# Patient Record
Sex: Male | Born: 1975 | Race: Black or African American | Hispanic: No | Marital: Married | Smoking: Never smoker
Health system: Southern US, Community
[De-identification: ages and names within clinical notes are randomized; demographics above are authoritative.]

## PROBLEM LIST (undated history)

## (undated) DIAGNOSIS — E119 Type 2 diabetes mellitus without complications: Secondary | ICD-10-CM

## (undated) DIAGNOSIS — I10 Essential (primary) hypertension: Secondary | ICD-10-CM

## (undated) HISTORY — PX: BACK SURGERY: SHX140

---

## 2013-01-03 DIAGNOSIS — I10 Essential (primary) hypertension: Secondary | ICD-10-CM | POA: Diagnosis present

## 2013-01-03 DIAGNOSIS — E119 Type 2 diabetes mellitus without complications: Secondary | ICD-10-CM | POA: Insufficient documentation

## 2013-01-03 DIAGNOSIS — E785 Hyperlipidemia, unspecified: Secondary | ICD-10-CM | POA: Insufficient documentation

## 2017-08-30 ENCOUNTER — Encounter (HOSPITAL_COMMUNITY): Payer: Self-pay | Admitting: Emergency Medicine

## 2017-08-30 ENCOUNTER — Inpatient Hospital Stay (HOSPITAL_COMMUNITY)
Admission: EM | Admit: 2017-08-30 | Discharge: 2017-09-02 | DRG: 075 | Disposition: A | Discharge status: Home / Self Care | Payer: Medicaid Other | Attending: Internal Medicine | Admitting: Internal Medicine

## 2017-08-30 ENCOUNTER — Other Ambulatory Visit: Payer: Self-pay

## 2017-08-30 DIAGNOSIS — R509 Fever, unspecified: Secondary | ICD-10-CM

## 2017-08-30 DIAGNOSIS — E1165 Type 2 diabetes mellitus with hyperglycemia: Secondary | ICD-10-CM | POA: Diagnosis present

## 2017-08-30 DIAGNOSIS — Z91128 Patient's intentional underdosing of medication regimen for other reason: Secondary | ICD-10-CM

## 2017-08-30 DIAGNOSIS — I10 Essential (primary) hypertension: Secondary | ICD-10-CM | POA: Diagnosis present

## 2017-08-30 DIAGNOSIS — A879 Viral meningitis, unspecified: Principal | ICD-10-CM | POA: Diagnosis present

## 2017-08-30 DIAGNOSIS — Z833 Family history of diabetes mellitus: Secondary | ICD-10-CM

## 2017-08-30 DIAGNOSIS — R51 Headache: Secondary | ICD-10-CM

## 2017-08-30 DIAGNOSIS — R519 Headache, unspecified: Secondary | ICD-10-CM

## 2017-08-30 DIAGNOSIS — R6510 Systemic inflammatory response syndrome (SIRS) of non-infectious origin without acute organ dysfunction: Secondary | ICD-10-CM | POA: Diagnosis present

## 2017-08-30 HISTORY — DX: Essential (primary) hypertension: I10

## 2017-08-30 HISTORY — DX: Type 2 diabetes mellitus without complications: E11.9

## 2017-08-30 LAB — URINALYSIS, ROUTINE W REFLEX MICROSCOPIC
BACTERIA UA: NONE SEEN
Bilirubin Urine: NEGATIVE
Glucose, UA: 50 mg/dL — AB
Ketones, ur: 5 mg/dL — AB
Leukocytes, UA: NEGATIVE
Nitrite: NEGATIVE
PH: 7 (ref 5.0–8.0)
Protein, ur: 30 mg/dL — AB
SPECIFIC GRAVITY, URINE: 1.02 (ref 1.005–1.030)

## 2017-08-30 LAB — CBC
HCT: 42.3 % (ref 39.0–52.0)
HEMOGLOBIN: 13.6 g/dL (ref 13.0–17.0)
MCH: 26.4 pg (ref 26.0–34.0)
MCHC: 32.2 g/dL (ref 30.0–36.0)
MCV: 82 fL (ref 78.0–100.0)
Platelets: 193 10*3/uL (ref 150–400)
RBC: 5.16 MIL/uL (ref 4.22–5.81)
RDW: 12.3 % (ref 11.5–15.5)
WBC: 6 10*3/uL (ref 4.0–10.5)

## 2017-08-30 MED ORDER — ONDANSETRON 4 MG PO TBDP
4.0000 mg | ORAL_TABLET | Freq: Once | ORAL | Status: AC | PRN
  Administered 2017-08-30: 4 mg via ORAL
  Filled 2017-08-30: qty 1

## 2017-08-30 NOTE — ED Triage Notes (Signed)
Pt states he began having a headache yesterday accompanied by fever, nausea, vomiting and body aches. No diarrhea.

## 2017-08-31 ENCOUNTER — Other Ambulatory Visit: Payer: Self-pay

## 2017-08-31 ENCOUNTER — Encounter (HOSPITAL_COMMUNITY): Payer: Self-pay | Admitting: Internal Medicine

## 2017-08-31 ENCOUNTER — Emergency Department (HOSPITAL_COMMUNITY): Payer: Medicaid Other

## 2017-08-31 ENCOUNTER — Inpatient Hospital Stay (HOSPITAL_COMMUNITY): Payer: Medicaid Other

## 2017-08-31 DIAGNOSIS — R509 Fever, unspecified: Secondary | ICD-10-CM

## 2017-08-31 DIAGNOSIS — A879 Viral meningitis, unspecified: Secondary | ICD-10-CM | POA: Diagnosis present

## 2017-08-31 DIAGNOSIS — Z833 Family history of diabetes mellitus: Secondary | ICD-10-CM | POA: Diagnosis not present

## 2017-08-31 DIAGNOSIS — R51 Headache: Secondary | ICD-10-CM | POA: Diagnosis present

## 2017-08-31 DIAGNOSIS — I10 Essential (primary) hypertension: Secondary | ICD-10-CM

## 2017-08-31 DIAGNOSIS — E1165 Type 2 diabetes mellitus with hyperglycemia: Secondary | ICD-10-CM

## 2017-08-31 DIAGNOSIS — R6510 Systemic inflammatory response syndrome (SIRS) of non-infectious origin without acute organ dysfunction: Secondary | ICD-10-CM | POA: Diagnosis not present

## 2017-08-31 DIAGNOSIS — Z91128 Patient's intentional underdosing of medication regimen for other reason: Secondary | ICD-10-CM | POA: Diagnosis not present

## 2017-08-31 LAB — COMPREHENSIVE METABOLIC PANEL
ALBUMIN: 4.5 g/dL (ref 3.5–5.0)
ALT: 17 U/L (ref 0–44)
ANION GAP: 10 (ref 5–15)
AST: 19 U/L (ref 15–41)
Alkaline Phosphatase: 54 U/L (ref 38–126)
BUN: 7 mg/dL (ref 6–20)
CALCIUM: 9.2 mg/dL (ref 8.9–10.3)
CO2: 26 mmol/L (ref 22–32)
Chloride: 104 mmol/L (ref 98–111)
Creatinine, Ser: 0.9 mg/dL (ref 0.61–1.24)
GFR calc Af Amer: >60 mL/min (ref >60)
GFR calc non Af Amer: >60 mL/min (ref >60)
GLUCOSE: 163 mg/dL — AB (ref 70–99)
Potassium: 3.7 mmol/L (ref 3.5–5.1)
Sodium: 140 mmol/L (ref 135–145)
TOTAL PROTEIN: 7.5 g/dL (ref 6.5–8.1)
Total Bilirubin: 2.3 mg/dL — ABNORMAL HIGH (ref 0.3–1.2)

## 2017-08-31 LAB — GROUP A STREP BY PCR: GROUP A STREP BY PCR: NOT DETECTED

## 2017-08-31 LAB — CSF CELL COUNT WITH DIFFERENTIAL
Lymphs, CSF: 60 % (ref 40–80)
MONOCYTE-MACROPHAGE-SPINAL FLUID: 39 % (ref 15–45)
RBC COUNT CSF: 2 /mm3 — AB
Segmented Neutrophils-CSF: 1 % (ref 0–6)
TUBE #: 3
WBC, CSF: 131 /mm3 (ref 0–5)

## 2017-08-31 LAB — GLUCOSE, CAPILLARY
Glucose-Capillary: 172 mg/dL — ABNORMAL HIGH (ref 70–99)
Glucose-Capillary: 248 mg/dL — ABNORMAL HIGH (ref 70–99)
Glucose-Capillary: 222 mg/dL — ABNORMAL HIGH (ref 70–99)
Glucose-Capillary: 215 mg/dL — ABNORMAL HIGH (ref 70–99)
Glucose-Capillary: 194 mg/dL — ABNORMAL HIGH (ref 70–99)

## 2017-08-31 LAB — HIV ANTIBODY (ROUTINE TESTING W REFLEX): HIV Screen 4th Generation wRfx: NONREACTIVE

## 2017-08-31 LAB — PROTEIN, CSF: Total  Protein, CSF: 51 mg/dL — ABNORMAL HIGH (ref 15–45)

## 2017-08-31 LAB — SEDIMENTATION RATE: SED RATE: 0 mm/h (ref 0–16)

## 2017-08-31 LAB — GLUCOSE, CSF: Glucose, CSF: 114 mg/dL — ABNORMAL HIGH (ref 40–70)

## 2017-08-31 LAB — HEMOGLOBIN A1C
Hgb A1c MFr Bld: 7.7 % — ABNORMAL HIGH (ref 4.8–5.6)
Mean Plasma Glucose: 174.29 mg/dL

## 2017-08-31 LAB — LIPASE, BLOOD: Lipase: 25 U/L (ref 11–51)

## 2017-08-31 LAB — I-STAT CG4 LACTIC ACID, ED: Lactic Acid, Venous: 1.56 mmol/L (ref 0.5–1.9)

## 2017-08-31 MED ORDER — DEXTROSE 5 % IV SOLN
10.0000 mg/kg | Freq: Three times a day (TID) | INTRAVENOUS | Status: DC
  Administered 2017-08-31 – 2017-09-02 (×6): 1020 mg via INTRAVENOUS
  Filled 2017-08-31 (×2): qty 20.4
  Filled 2017-08-31: qty 10
  Filled 2017-08-31 (×5): qty 20.4

## 2017-08-31 MED ORDER — SODIUM CHLORIDE 0.9 % IV SOLN
INTRAVENOUS | Status: AC
  Administered 2017-08-31 (×3): via INTRAVENOUS

## 2017-08-31 MED ORDER — VANCOMYCIN HCL IN DEXTROSE 1-5 GM/200ML-% IV SOLN: 1000.0000 mg | Freq: Once | INTRAVENOUS | Status: DC

## 2017-08-31 MED ORDER — SODIUM CHLORIDE 0.9 % IV SOLN
INTRAVENOUS | Status: DC | PRN
  Administered 2017-08-31 – 2017-09-01 (×2): via INTRAVENOUS

## 2017-08-31 MED ORDER — VANCOMYCIN HCL 10 G IV SOLR
2000.0000 mg | Freq: Once | INTRAVENOUS | Status: AC
  Administered 2017-08-31: 2000 mg via INTRAVENOUS
  Filled 2017-08-31: qty 2000

## 2017-08-31 MED ORDER — DEXAMETHASONE SODIUM PHOSPHATE 10 MG/ML IJ SOLN
10.0000 mg | Freq: Once | INTRAMUSCULAR | Status: AC
  Administered 2017-08-31: 10 mg via INTRAVENOUS
  Filled 2017-08-31: qty 1

## 2017-08-31 MED ORDER — ACETAMINOPHEN 650 MG RE SUPP: 650.0000 mg | Freq: Four times a day (QID) | RECTAL | Status: DC | PRN

## 2017-08-31 MED ORDER — ONDANSETRON HCL 4 MG/2ML IJ SOLN
4.0000 mg | Freq: Once | INTRAMUSCULAR | Status: AC
  Administered 2017-08-31: 4 mg via INTRAVENOUS
  Filled 2017-08-31: qty 2

## 2017-08-31 MED ORDER — ACETAMINOPHEN 325 MG PO TABS
650.0000 mg | ORAL_TABLET | Freq: Once | ORAL | Status: AC
  Administered 2017-08-31: 650 mg via ORAL
  Filled 2017-08-31: qty 2

## 2017-08-31 MED ORDER — ACETAMINOPHEN 325 MG PO TABS
650.0000 mg | ORAL_TABLET | Freq: Four times a day (QID) | ORAL | Status: DC | PRN
  Administered 2017-08-31 – 2017-09-01 (×2): 650 mg via ORAL
  Filled 2017-08-31 (×2): qty 2

## 2017-08-31 MED ORDER — HYDRALAZINE HCL 20 MG/ML IJ SOLN
10.0000 mg | INTRAMUSCULAR | Status: DC | PRN
  Administered 2017-09-02: 10 mg via INTRAVENOUS
  Filled 2017-08-31: qty 1

## 2017-08-31 MED ORDER — ONDANSETRON HCL 4 MG PO TABS: 4.0000 mg | ORAL_TABLET | Freq: Four times a day (QID) | ORAL | Status: DC | PRN

## 2017-08-31 MED ORDER — SODIUM CHLORIDE 0.9 % IV SOLN
2.0000 g | Freq: Two times a day (BID) | INTRAVENOUS | Status: DC
  Administered 2017-08-31: 2 g via INTRAVENOUS
  Filled 2017-08-31 (×2): qty 20

## 2017-08-31 MED ORDER — CEFTRIAXONE SODIUM 2 G IJ SOLR
2.0000 g | Freq: Once | INTRAMUSCULAR | Status: AC
  Administered 2017-08-31: 2 g via INTRAVENOUS
  Filled 2017-08-31: qty 20

## 2017-08-31 MED ORDER — ONDANSETRON HCL 4 MG/2ML IJ SOLN
4.0000 mg | Freq: Four times a day (QID) | INTRAMUSCULAR | Status: DC | PRN
  Administered 2017-09-01: 4 mg via INTRAVENOUS
  Filled 2017-08-31: qty 2

## 2017-08-31 MED ORDER — LIDOCAINE HCL (PF) 1 % IJ SOLN
INTRAMUSCULAR | Status: AC
  Administered 2017-08-31: 15:00:00
  Filled 2017-08-31: qty 5

## 2017-08-31 MED ORDER — INSULIN ASPART 100 UNIT/ML ~~LOC~~ SOLN
0.0000 [IU] | Freq: Three times a day (TID) | SUBCUTANEOUS | Status: DC
  Administered 2017-08-31: 2 [IU] via SUBCUTANEOUS
  Administered 2017-08-31 (×2): 3 [IU] via SUBCUTANEOUS
  Administered 2017-09-01 – 2017-09-02 (×4): 2 [IU] via SUBCUTANEOUS

## 2017-08-31 MED ORDER — LIDOCAINE HCL (PF) 1 % IJ SOLN
5.0000 mL | Freq: Once | INTRAMUSCULAR | Status: AC
  Filled 2017-08-31: qty 5

## 2017-08-31 MED ORDER — SODIUM CHLORIDE 0.9 % IV BOLUS
1000.0000 mL | Freq: Once | INTRAVENOUS | Status: AC
  Administered 2017-08-31: 1000 mL via INTRAVENOUS

## 2017-08-31 MED ORDER — VANCOMYCIN HCL IN DEXTROSE 1-5 GM/200ML-% IV SOLN
1000.0000 mg | Freq: Three times a day (TID) | INTRAVENOUS | Status: DC
  Administered 2017-08-31 – 2017-09-01 (×3): 1000 mg via INTRAVENOUS
  Filled 2017-08-31 (×4): qty 200

## 2017-08-31 MED ORDER — LIDOCAINE HCL (PF) 1 % IJ SOLN
INTRAMUSCULAR | Status: AC
  Filled 2017-08-31: qty 30

## 2017-08-31 MED ORDER — MORPHINE SULFATE (PF) 4 MG/ML IV SOLN
4.0000 mg | Freq: Once | INTRAVENOUS | Status: AC
  Administered 2017-08-31: 4 mg via INTRAVENOUS
  Filled 2017-08-31: qty 1

## 2017-08-31 MED ORDER — MORPHINE SULFATE (PF) 2 MG/ML IV SOLN
1.0000 mg | INTRAVENOUS | Status: DC | PRN
  Administered 2017-08-31 – 2017-09-02 (×4): 1 mg via INTRAVENOUS
  Filled 2017-08-31 (×4): qty 1

## 2017-08-31 NOTE — H&P (Signed)
History and Physical    Noah Bates ZOX:[id number] DOB: 24-Jan-1976 DOA: 08/30/2017  PCP: Patient, No Pcp Per  Patient coming from: Home.  Chief Complaint: Headache.  HPI: Noah Bates is a 42 y.o. male with history of diabetes mellitus type 2, hypertension who had moved from [place] last October 8 months ago and has not been taking his medications presents to the ER with complaints of headache and fever chills.  Patient has been having the symptoms for last 48 hours.  Denies any recent travel.  Patient states his friend has been sick last week.  Patient had multiple episodes of nausea vomiting denies any abdominal pain or diarrhea.  Has some photophobia.  Had some neck pain.  Has not had any meningitis previously.  Patient is alert awake and oriented.  ED Course: In the ER CT head is unremarkable chest x-ray unremarkable UA did not show any signs of infection.  Blood cultures were obtained and lumbar puncture was attempted by the ER physician which was unsuccessful.  At this time fluoroscopic guided lumbar puncture has been ordered and patient was started empirically on ceftriaxone vancomycin and acyclovir.  Review of Systems: As per HPI, rest all negative.   Past Medical History:  Diagnosis Date  . Diabetes mellitus without complication (HCC)   . Hypertension     History reviewed. No pertinent surgical history.   reports that he has never smoked. He has never used smokeless tobacco. He reports that he drank alcohol. He reports that he does not use drugs.  Not on File  Family History  Problem Relation Age of Onset  . Diabetes Mellitus II Mother   . Diabetes Mellitus II Father     Prior to Admission medications   Not on File    Physical Exam: Vitals:   08/31/17 0354 08/31/17 0507 08/31/17 0530 08/31/17 0600  BP: (!) 188/95  (!) 163/82 (!) 156/81  Pulse: (!) 103  100 (!) 103  Resp: 18  18   Temp: 99.9 F (37.7 C)     TempSrc: Oral     SpO2: 100%  97%  98%  Weight:  102.1 kg (225 lb)    Height:  6\' 7"  (2.007 m)        Constitutional: Moderately built and nourished. Vitals:   08/31/17 0354 08/31/17 0507 08/31/17 0530 08/31/17 0600  BP: (!) 188/95  (!) 163/82 (!) 156/81  Pulse: (!) 103  100 (!) 103  Resp: 18  18   Temp: 99.9 F (37.7 C)     TempSrc: Oral     SpO2: 100%  97% 98%  Weight:  102.1 kg (225 lb)    Height:  6\' 7"  (2.007 m)     Eyes: Anicteric no pallor. ENMT: No discharge from the ears eyes nose or mouth. Neck: No mass felt.  Mild neck rigidity. Respiratory: No rhonchi or crepitations. Cardiovascular: S1-S2 heard no murmurs appreciated. Abdomen: Soft nontender bowel sounds present. Musculoskeletal: No edema.  No joint effusion. Skin: No rash. Neurologic: Alert awake oriented to time place and person.  Moves all activities. Psychiatric: Appears normal per normal affect.   Labs on Admission: I have personally reviewed following labs and imaging studies  CBC: Recent Labs  Lab 08/30/17 2324  WBC 6.0  HGB 13.6  HCT 42.3  MCV 82.0  PLT 193   Basic Metabolic Panel: Recent Labs  Lab 08/30/17 2324  NA 140  K 3.7  CL 104  CO2 26  GLUCOSE 163*  BUN 7  CREATININE 0.90  CALCIUM 9.2   GFR: Estimated Creatinine Clearance: 140.3 mL/min (by C-G formula based on SCr of 0.9 mg/dL). Liver Function Tests: Recent Labs  Lab 08/30/17 2324  AST 19  ALT 17  ALKPHOS 54  BILITOT 2.3*  PROT 7.5  ALBUMIN 4.5   Recent Labs  Lab 08/30/17 2324  LIPASE 25   No results for input(s): AMMONIA in the last 168 hours. Coagulation Profile: No results for input(s): INR, PROTIME in the last 168 hours. Cardiac Enzymes: No results for input(s): CKTOTAL, CKMB, CKMBINDEX, TROPONINI in the last 168 hours. BNP (last 3 results) No results for input(s): PROBNP in the last 8760 hours. HbA1C: No results for input(s): HGBA1C in the last 72 hours. CBG: No results for input(s): GLUCAP in the last 168 hours. Lipid Profile: No  results for input(s): CHOL, HDL, LDLCALC, TRIG, CHOLHDL, LDLDIRECT in the last 72 hours. Thyroid Function Tests: No results for input(s): TSH, T4TOTAL, FREET4, T3FREE, THYROIDAB in the last 72 hours. Anemia Panel: No results for input(s): VITAMINB12, FOLATE, FERRITIN, TIBC, IRON, RETICCTPCT in the last 72 hours. Urine analysis:    Component Value Date/Time   COLORURINE YELLOW 08/30/2017 2255   APPEARANCEUR CLEAR 08/30/2017 2255   LABSPEC 1.020 08/30/2017 2255   PHURINE 7.0 08/30/2017 2255   GLUCOSEU 50 (A) 08/30/2017 2255   HGBUR SMALL (A) 08/30/2017 2255   BILIRUBINUR NEGATIVE 08/30/2017 2255   KETONESUR 5 (A) 08/30/2017 2255   PROTEINUR 30 (A) 08/30/2017 2255   NITRITE NEGATIVE 08/30/2017 2255   LEUKOCYTESUR NEGATIVE 08/30/2017 2255   Sepsis Labs: @LABRCNTIP (procalcitonin:4,lacticidven:4) ) Recent Results (from the past 240 hour(s))  Group A Strep by PCR     Status: None   Collection Time: 08/31/17  2:55 AM  Result Value Ref Range Status   Group A Strep by PCR NOT DETECTED NOT DETECTED Final    Comment: Performed at [facility], [address] [place], [place] [id number]     Radiological Exams on Admission: Dg Chest 2 View  Result Date: 08/31/2017 CLINICAL DATA:  Nausea, vomiting, fever and cough since yesterday. EXAM: CHEST - 2 VIEW COMPARISON:  None. FINDINGS: The heart size and mediastinal contours are within normal limits. Both lungs are clear. The visualized skeletal structures are unremarkable. IMPRESSION: No active cardiopulmonary disease. Electronically Signed   By: Tollie Ethavid  Kwon M.D.   On: 08/31/2017 02:11   Ct Head Wo Contrast  Result Date: 08/31/2017 CLINICAL DATA:  42 y/o M; headache, fever, lightheadedness, nausea, vomiting, and body aches since Monday. EXAM: CT HEAD WITHOUT CONTRAST TECHNIQUE: Contiguous axial images were obtained from the base of the skull through the vertex without intravenous contrast. COMPARISON:  None. FINDINGS: Brain: No evidence of  acute infarction, hemorrhage, hydrocephalus, extra-axial collection or mass lesion/mass effect. Vascular: No hyperdense vessel or unexpected calcification. Skull: Normal. Negative for fracture or focal lesion. Sinuses/Orbits: No acute finding. Other: None. IMPRESSION: Negative CT of the head. No acute intracranial abnormality identified. Electronically Signed   By: Mitzi HansenLance  Furusawa-Stratton M.D.   On: 08/31/2017 04:30     Assessment/Plan Active Problems:   SIRS (systemic inflammatory response syndrome) (HCC)   Essential hypertension   Type 2 diabetes mellitus with hyperglycemia (HCC)    1. SIRS likely from meningitis -fluoroscopic guided lumbar puncture has been ordered since the ER physician attempted lumbar puncture which was not successful.  Follow blood cultures CSF studies.  Patient is on empiric antibiotics for meningitis including vancomycin and ceftriaxone and acyclovir.  Continue with hydration pain relief medications. 2. Hypertension -has not been taking his medications for last 8 months.  Will keep patient on PRN IV hydralazine for now and closely follow blood pressure trends.  3. Diabetes mellitus type 2 -has not been taking his medications for last few months we will check hemoglobin A1c keep patient on sliding scale coverage.   DVT prophylaxis: SCDs. Code Status: Full code. Family Communication: Discussed with patient. Disposition Plan: Home. Consults called: None. Admission status: Inpatient.   Eduard Clos MD [facility] Pager [phone].  If 7PM-7AM, please contact night-coverage [email or web address] Password [facility]  08/31/2017, 6:49 AM

## 2017-08-31 NOTE — Progress Notes (Signed)
.  CRITICAL VALUE ALERT  Critical Value:  CSF WBC 131  Date & Time Notied:  1545 08/31/2017  Provider Notified: Rito EhrlichKrishnan, MD  Orders Received/Actions taken: awaiting MD orders and will treat accordingly.

## 2017-08-31 NOTE — Progress Notes (Signed)
Pt arrived to room at 0630. Pt A/Ox3, reporting mild headache, no nausea. Ambulates without difficulty. IVF's initiated, R/L hand IV's patent. Telemetry on, Call bell in reach, report given to MaldenKamila, Charity fundraiserN.

## 2017-08-31 NOTE — ED Notes (Signed)
ED Provider at bedside. 

## 2017-08-31 NOTE — Progress Notes (Signed)
Pharmacy Antibiotic Note  Mervin HackJamain Kem Kaysndra Casimir is a 42 y.o. male admitted on 08/30/2017 with menigitidis.  Pharmacy has been consulted for vancomycin and acyclovir dosing.  Plan: Vancomcyin 2gm IV x 1 then 1gm IV q8 hours Acyclovir 1020 mg IV q8 hours F/u renal function, cultures and clinical course  Height: 6\' 7"  (200.7 cm) Weight: 225 lb (102.1 kg) IBW/kg (Calculated) : 93.7  Temp (24hrs), Avg:100.6 F (38.1 C), Min:99.9 F (37.7 C), Max:101.6 F (38.7 C)  Recent Labs  Lab 08/30/17 2324 08/31/17 0237  WBC 6.0  --   CREATININE 0.90  --   LATICACIDVEN  --  1.56    Estimated Creatinine Clearance: 140.3 mL/min (by C-G formula based on SCr of 0.9 mg/dL).    Not on File  Thank you for allowing pharmacy to be a part of this patient's care.  Talbert CageSeay, Kelon Easom Poteet 08/31/2017 5:16 AM

## 2017-08-31 NOTE — Progress Notes (Signed)
Patient admitted earlier this morning.  H&P reviewed.  Patient seen and examined.  S: Patient states that he feels much better.  Headache has improved significantly.  2/10 intensity.  No longer has sensitivity to light.  Denies any nausea.  O: Noted to be afebrile this morning.  Blood pressure was somewhat elevated at 168/89.  Other vital signs stable.  Neck is soft and supple. Lungs are clear to auscultation bilaterally. S1-S2 is normal regular.  No S3-S4.  No rubs murmurs or bruit Abdomen is soft.  Nontender nondistended.  Bowel sounds are present.  No masses organomegaly  Labs reviewed.  A/P: Patient with fever headache nausea.  Concern was for meningitis.  Likely has had some kind of viral syndrome.  His WBC was normal.  Very unlikely to be a bacterial infection.  HIV nonreactive.  LP attempted at bedside was not successful.  LP has been ordered under fluoroscopy.  He has a history of hypertension but has not been taking his medications.  Blood pressure is poorly controlled.  Continue hydralazine as needed for now.  Will likely need definitive treatment at discharge.  Diabetes mellitus type 2: Patient has not taken his medications in a while.  ABGs noted to be elevated.  HbA1c 7.7.  He will need to resume his medication regimen.  Continue current management.  We will continue to follow.  Noah Bates 08/31/2017

## 2017-08-31 NOTE — Procedures (Signed)
CLINICAL DATA: Fever and headache, possible meningitis.  EXAM:  DIAGNOSTIC LUMBAR PUNCTURE UNDER FLUOROSCOPIC GUIDANCE  FLUOROSCOPY TIME: Fluoroscopy Time:  0 minutes, 30 seconds  Radiation Exposure Index (if provided by the fluoroscopic device):  5.2 mGy  Number of Acquired Spot Images: 0  PROCEDURE:  I discussed the risks (including hemorrhage, infection, headache, and nerve damage, among others), benefits, and alternatives to fluoroscopically guided lumbar puncture with the patient.  We specifically discussed the high technical likelihood of success of the procedure. The patient understood and elected to undergo the procedure.    Standard time-out was employed.  Following sterile skin prep and local anesthetic administration consisting of 1 percent lidocaine, a 22 gauge spinal needle was advanced without difficulty into the thecal sac at the at the L4-5 level.  Clear CSF was returned.  Opening pressure was 12 cm of water with the patient in the left lateral decubitus position.   13 cc of clear CSF was collected.  The needle was subsequently removed and the skin cleansed and bandaged.  No immediate complications were observed.    IMPRESSION: Technically successful fluoroscopically guided lumbar puncture yielding 13 cc of clear CSF.  Opening pressure was 12 cm water.

## 2017-08-31 NOTE — ED Notes (Signed)
Attempted to call report bed not assigned 

## 2017-08-31 NOTE — ED Provider Notes (Signed)
[facility] EMERGENCY DEPARTMENT Provider Note   CSN: [id number] Arrival date & time: 08/30/17  2214     History   Chief Complaint Chief Complaint  Patient presents with  . Headache  . Fever    HPI Noah Bates is a 42 y.o. male.  Patient with history of diabetes and hypertension who has been off of his medications for several months presenting with a 2-day history of headache, fever, nausea and vomiting.  States he developed a diffuse headache 2 nights ago this progressively worsened.  He developed a fever yesterday up to 102.  He is had multiple episodes of nausea and vomiting today that have been nonbilious and nonbloody.  Denies abdominal pain or diarrhea.  Denies cough, sore throat, runny nose.  No body aches.  Has had shaking chills and rigors.  Denies any recent out of the country travel.  No recent antibiotic use.  No camping trips or tick bites.  No IV drug abuse.  The history is provided by the patient.  Headache   Associated symptoms include a fever, nausea and vomiting. Pertinent negatives include no palpitations and no shortness of breath.  Fever   Associated symptoms include vomiting and headaches. Pertinent negatives include no chest pain, no congestion and no cough.    Past Medical History:  Diagnosis Date  . Diabetes mellitus without complication (HCC)   . Hypertension     There are no active problems to display for this patient.   History reviewed. No pertinent surgical history.      Home Medications    Prior to Admission medications   Not on File    Family History No family history on file.  Social History Social History   Tobacco Use  . Smoking status: Never Smoker  . Smokeless tobacco: Never Used  Substance Use Topics  . Alcohol use: Not Currently  . Drug use: Never     Allergies   Patient has no allergy information on record.   Review of Systems Review of Systems  Constitutional: Positive for  activity change, appetite change and fever.  HENT: Negative for congestion.   Eyes: Positive for photophobia.  Respiratory: Negative for cough, chest tightness and shortness of breath.   Cardiovascular: Negative for chest pain, palpitations and leg swelling.  Gastrointestinal: Positive for nausea and vomiting. Negative for abdominal pain.  Genitourinary: Negative for dysuria, flank pain, scrotal swelling and testicular pain.  Musculoskeletal: Negative for arthralgias and myalgias.  Neurological: Positive for headaches. Negative for light-headedness.  Hematological: Negative for adenopathy.   all other systems are negative except as noted in the HPI and PMH.     Physical Exam Updated Vital Signs BP (!) 223/98   Pulse 93   Temp 100.3 F (37.9 C)   Resp 18   SpO2 100%   Physical Exam  Constitutional: He is oriented to person, place, and time. He appears well-developed and well-nourished. No distress.  Ill appearing  HENT:  Head: Normocephalic and atraumatic.  Mouth/Throat: Oropharynx is clear and moist. No oropharyngeal exudate.  Eyes: Pupils are equal, round, and reactive to light. Conjunctivae and EOM are normal.  Neck: Normal range of motion. Neck supple.  No meningismus.  Cardiovascular: Normal rate, regular rhythm, normal heart sounds and intact distal pulses.  No murmur heard. Pulmonary/Chest: Effort normal and breath sounds normal. No respiratory distress. He exhibits no tenderness.  Abdominal: Soft. There is no tenderness. There is no rebound and no guarding.  Musculoskeletal: Normal range of  motion. He exhibits no edema or tenderness.  Neurological: He is alert and oriented to person, place, and time. No cranial nerve deficit. He exhibits normal muscle tone. Coordination normal.  No ataxia on finger to nose bilaterally. No pronator drift. 5/5 strength throughout. CN 2-12 intact.Equal grip strength. Sensation intact.   Skin: Skin is warm. Capillary refill takes less than 2  seconds. No rash noted. He is diaphoretic.  Psychiatric: He has a normal mood and affect. His behavior is normal.  Nursing note and vitals reviewed.    ED Treatments / Results  Labs (all labs ordered are listed, but only abnormal results are displayed) Labs Reviewed  COMPREHENSIVE METABOLIC PANEL - Abnormal; Notable for the following components:      Result Value   Glucose, Bld 163 (*)    Total Bilirubin 2.3 (*)    All other components within normal limits  URINALYSIS, ROUTINE W REFLEX MICROSCOPIC - Abnormal; Notable for the following components:   Glucose, UA 50 (*)    Hgb urine dipstick SMALL (*)    Ketones, ur 5 (*)    Protein, ur 30 (*)    All other components within normal limits  GROUP A STREP BY PCR  CULTURE, BLOOD (ROUTINE X 2)  CULTURE, BLOOD (ROUTINE X 2)  CSF CULTURE  GRAM STAIN  HSV CULTURE AND TYPING  LIPASE, BLOOD  CBC  CSF CELL COUNT WITH DIFFERENTIAL  CSF CELL COUNT WITH DIFFERENTIAL  GLUCOSE, CSF  PROTEIN, CSF  HERPES SIMPLEX VIRUS(HSV) DNA BY PCR  I-STAT CG4 LACTIC ACID, ED    EKG None  Radiology Dg Chest 2 View  Result Date: 08/31/2017 CLINICAL DATA:  Nausea, vomiting, fever and cough since yesterday. EXAM: CHEST - 2 VIEW COMPARISON:  None. FINDINGS: The heart size and mediastinal contours are within normal limits. Both lungs are clear. The visualized skeletal structures are unremarkable. IMPRESSION: No active cardiopulmonary disease. Electronically Signed   By: Tollie Ethavid  Kwon M.D.   On: 08/31/2017 02:11   Ct Head Wo Contrast  Result Date: 08/31/2017 CLINICAL DATA:  42 y/o M; headache, fever, lightheadedness, nausea, vomiting, and body aches since Monday. EXAM: CT HEAD WITHOUT CONTRAST TECHNIQUE: Contiguous axial images were obtained from the base of the skull through the vertex without intravenous contrast. COMPARISON:  None. FINDINGS: Brain: No evidence of acute infarction, hemorrhage, hydrocephalus, extra-axial collection or mass lesion/mass effect.  Vascular: No hyperdense vessel or unexpected calcification. Skull: Normal. Negative for fracture or focal lesion. Sinuses/Orbits: No acute finding. Other: None. IMPRESSION: Negative CT of the head. No acute intracranial abnormality identified. Electronically Signed   By: Mitzi HansenLance  Furusawa-Stratton M.D.   On: 08/31/2017 04:30    Procedures .Lumbar Puncture Date/Time: 08/31/2017 5:01 AM Performed by: Glynn Octaveancour, Kunio Cummiskey, MD Authorized by: Glynn Octaveancour, Ojani Berenson, MD   Consent:    Consent obtained:  Written   Consent given by:  Patient   Risks discussed:  Bleeding, infection, headache, nerve damage and pain Pre-procedure details:    Procedure purpose:  Diagnostic   Preparation: Patient was prepped and draped in usual sterile fashion   Anesthesia (see MAR for exact dosages):    Anesthesia method:  Local infiltration   Local anesthetic:  Lidocaine 1% w/o epi Procedure details:    Lumbar space:  L4-L5 interspace   Patient position:  Sitting   Needle gauge:  22   Needle type:  Spinal needle - Quincke tip   Needle length (in):  2.5   Ultrasound guidance: no     Number of attempts:  3   Total volume (ml):  0 Post-procedure:    Puncture site:  Adhesive bandage applied   Patient tolerance of procedure:  Tolerated well, no immediate complications Comments:     No CSF obtained   (including critical care time)  Medications Ordered in ED Medications  sodium chloride 0.9 % bolus 1,000 mL (has no administration in time range)  ondansetron (ZOFRAN) injection 4 mg (has no administration in time range)  ondansetron (ZOFRAN-ODT) disintegrating tablet 4 mg (4 mg Oral Given 08/30/17 2316)     Initial Impression / Assessment and Plan / ED Course  I have reviewed the triage vital signs and the nursing notes.  Pertinent labs & imaging results that were available during my care of the patient were reviewed by me and considered in my medical decision making (see chart for details).    Patient with a 2-day  history of headache, fever, nausea and vomiting.  He has a nonfocal neurological exam with no meningismus.  He does have pain with range of motion of his head.  He is hypertensive and has been off of his medications for several months  No leukocytosis.  Patient given IV fluids and antiemetics.  CT head is negative.  Chest x-ray is negative.  Work-up reveals no alternative explanation for patient's fever.  Will proceed with lumbar puncture given his headache with vomiting and photophobia.  Lumbar puncture was unsuccessful as above.  Will start empiric antibiotics for possible meningitis or encephalitis as well as acyclovir.  Patient agreeable to LP under fluoroscopy later today.  Admission discussed with Dr. Toniann Fail.  Final Clinical Impressions(s) / ED Diagnoses   Final diagnoses:  Bad headache  Fever, unspecified    ED Discharge Orders    None       Sharnita Bogucki, Jeannett Senior, MD 08/31/17 [id number]

## 2017-09-01 DIAGNOSIS — A879 Viral meningitis, unspecified: Principal | ICD-10-CM

## 2017-09-01 LAB — CBC WITH DIFFERENTIAL/PLATELET
ABS IMMATURE GRANULOCYTES: 0 10*3/uL (ref 0.0–0.1)
BASOS PCT: 0 %
Basophils Absolute: 0 10*3/uL (ref 0.0–0.1)
Eosinophils Absolute: 0 10*3/uL (ref 0.0–0.7)
Eosinophils Relative: 1 %
HEMATOCRIT: 37.4 % — AB (ref 39.0–52.0)
HEMOGLOBIN: 12.3 g/dL — AB (ref 13.0–17.0)
Immature Granulocytes: 0 %
LYMPHS ABS: 1.7 10*3/uL (ref 0.7–4.0)
LYMPHS PCT: 36 %
MCH: 26.3 pg (ref 26.0–34.0)
MCHC: 32.9 g/dL (ref 30.0–36.0)
MCV: 79.9 fL (ref 78.0–100.0)
MONO ABS: 0.5 10*3/uL (ref 0.1–1.0)
MONOS PCT: 10 %
Neutro Abs: 2.6 10*3/uL (ref 1.7–7.7)
Neutrophils Relative %: 53 %
Platelets: 151 10*3/uL (ref 150–400)
RBC: 4.68 MIL/uL (ref 4.22–5.81)
RDW: 12.2 % (ref 11.5–15.5)
WBC: 4.9 10*3/uL (ref 4.0–10.5)

## 2017-09-01 LAB — HEPATIC FUNCTION PANEL
ALT: 14 U/L (ref 0–44)
AST: 14 U/L — AB (ref 15–41)
Albumin: 3.5 g/dL (ref 3.5–5.0)
Alkaline Phosphatase: 40 U/L (ref 38–126)
BILIRUBIN DIRECT: 0.2 mg/dL (ref 0.0–0.2)
Indirect Bilirubin: 1.2 mg/dL — ABNORMAL HIGH (ref 0.3–0.9)
Total Bilirubin: 1.4 mg/dL — ABNORMAL HIGH (ref 0.3–1.2)
Total Protein: 5.8 g/dL — ABNORMAL LOW (ref 6.5–8.1)

## 2017-09-01 LAB — BASIC METABOLIC PANEL
ANION GAP: 7 (ref 5–15)
BUN: 10 mg/dL (ref 6–20)
CO2: 26 mmol/L (ref 22–32)
Calcium: 8.7 mg/dL — ABNORMAL LOW (ref 8.9–10.3)
Chloride: 107 mmol/L (ref 98–111)
Creatinine, Ser: 0.72 mg/dL (ref 0.61–1.24)
GFR calc Af Amer: >60 mL/min (ref >60)
GLUCOSE: 173 mg/dL — AB (ref 70–99)
POTASSIUM: 3.9 mmol/L (ref 3.5–5.1)
Sodium: 140 mmol/L (ref 135–145)

## 2017-09-01 LAB — GLUCOSE, CAPILLARY
GLUCOSE-CAPILLARY: 183 mg/dL — AB (ref 70–99)
GLUCOSE-CAPILLARY: 225 mg/dL — AB (ref 70–99)
Glucose-Capillary: 189 mg/dL — ABNORMAL HIGH (ref 70–99)
Glucose-Capillary: 177 mg/dL — ABNORMAL HIGH (ref 70–99)

## 2017-09-01 LAB — PATHOLOGIST SMEAR REVIEW: Path Review: INCREASED

## 2017-09-01 MED ORDER — LISINOPRIL 20 MG PO TABS
20.0000 mg | ORAL_TABLET | Freq: Every day | ORAL | Status: DC
  Administered 2017-09-01 – 2017-09-02 (×2): 20 mg via ORAL
  Filled 2017-09-01 (×2): qty 1

## 2017-09-01 MED ORDER — INSULIN ASPART 100 UNIT/ML ~~LOC~~ SOLN
6.0000 [IU] | Freq: Once | SUBCUTANEOUS | Status: AC
  Administered 2017-09-02: 6 [IU] via SUBCUTANEOUS

## 2017-09-01 NOTE — Progress Notes (Signed)
[facility] PROGRESS NOTE  Noah Bates ZOX:[id number] DOB: June 20, 1975 DOA: 08/30/2017  PCP: Patient, No Pcp Per  Brief History/Interval Summary: 42 year old African-American male with a past medical history of essential hypertension and diabetes mellitus type 2 for which she no longer takes medications presented with fever headache.  Concern was for meningitis.  Patient was hospitalized.  Reason for Visit: Acute viral meningitis  Consultants: None  Procedures: Lumbar puncture  Antibiotics: Initially started on ceftriaxone vancomycin and acyclovir.  Vancomycin and ceftriaxone discontinued today.  Subjective/Interval History: Patient states that he feels better.  Did have headache this morning resolved with morphine.  Denies any nausea or vomiting  ROS: Denies any chest discomfort  Objective:  Vital Signs  Vitals:   08/31/17 1428 08/31/17 2152 09/01/17 0615 09/01/17 1005  BP: (!) 188/85 (!) 158/78 (!) 168/86 (!) 185/88  Pulse: 81 78 71 81  Resp: 18 18 18 18   Temp: 98.6 F (37 C) 97.6 F (36.4 C) 97.8 F (36.6 C) 97.8 F (36.6 C)  TempSrc: Oral  Oral Oral  SpO2: 97% 100% 100% 100%  Weight:      Height:        Intake/Output Summary (Last 24 hours) at 09/01/2017 1301 Last data filed at 09/01/2017 8119 Gross per 24 hour  Intake 2911.94 ml  Output 800 ml  Net 2111.94 ml   Filed Weights   08/31/17 0507  Weight: 102.1 kg (225 lb)    General appearance: alert, cooperative, appears stated age and no distress Head: Normocephalic, without obvious abnormality, atraumatic Resp: clear to auscultation bilaterally Cardio: regular rate and rhythm, S1, S2 normal, no murmur, click, rub or gallop GI: soft, non-tender; bowel sounds normal; no masses,  no organomegaly Extremities: extremities normal, atraumatic, no cyanosis or edema Pulses: 2+ and symmetric Neurologic: No focal neurological deficits.  Lab Results:  Data Reviewed: I have personally reviewed  following labs and imaging studies  CBC: Recent Labs  Lab 08/30/17 2324 09/01/17 0605  WBC 6.0 4.9  NEUTROABS  --  2.6  HGB 13.6 12.3*  HCT 42.3 37.4*  MCV 82.0 79.9  PLT 193 151    Basic Metabolic Panel: Recent Labs  Lab 08/30/17 2324 09/01/17 0605  NA 140 140  K 3.7 3.9  CL 104 107  CO2 26 26  GLUCOSE 163* 173*  BUN 7 10  CREATININE 0.90 0.72  CALCIUM 9.2 8.7*    GFR: Estimated Creatinine Clearance: 157.8 mL/min (by C-G formula based on SCr of 0.72 mg/dL).  Liver Function Tests: Recent Labs  Lab 08/30/17 2324 09/01/17 0605  AST 19 14*  ALT 17 14  ALKPHOS 54 40  BILITOT 2.3* 1.4*  PROT 7.5 5.8*  ALBUMIN 4.5 3.5    Recent Labs  Lab 08/30/17 2324  LIPASE 25    HbA1C: Recent Labs    08/31/17 0732  HGBA1C 7.7*    CBG: Recent Labs  Lab 08/31/17 1432 08/31/17 1636 08/31/17 2121 09/01/17 0741 09/01/17 1147  GLUCAP 222* 215* 194* 189* 177*     Recent Results (from the past 240 hour(s))  Blood culture (routine x 2)     Status: None (Preliminary result)   Collection Time: 08/31/17  2:30 AM  Result Value Ref Range Status   Specimen Description BLOOD RIGHT ANTECUBITAL  Final   Special Requests   Final    BOTTLES DRAWN AEROBIC AND ANAEROBIC Blood Culture results may not be optimal due to an inadequate volume of blood received in culture bottles   Culture  Final    NO GROWTH 1 DAY Performed at [facility], [address] [place], [place] [address]    Report Status PENDING  Incomplete  Group A Strep by PCR     Status: None   Collection Time: 08/31/17  2:55 AM  Result Value Ref Range Status   Group A Strep by PCR NOT DETECTED NOT DETECTED Final    Comment: Performed at [facility], [address] [place], [place] [address]  Blood culture (routine x 2)     Status: None (Preliminary result)   Collection Time: 08/31/17  3:00 AM  Result Value Ref Range Status   Specimen Description BLOOD RIGHT HAND  Final   Special Requests    Final    BOTTLES DRAWN AEROBIC AND ANAEROBIC Blood Culture adequate volume   Culture   Final    NO GROWTH 1 DAY Performed at [facility], [address] [place], [place] [address]    Report Status PENDING  Incomplete  CSF culture     Status: None (Preliminary result)   Collection Time: 08/31/17  1:59 PM  Result Value Ref Range Status   Specimen Description CSF  Final   Special Requests NONE  Final   Gram Stain   Final    WBC PRESENT,BOTH PMN AND MONONUCLEAR NO ORGANISMS SEEN CYTOSPIN SMEAR    Culture   Final    NO GROWTH < 24 HOURS Performed at [facility], [address] [place], [place] [address]    Report Status PENDING  Incomplete      Radiology Studies: Dg Chest 2 View  Result Date: 08/31/2017 CLINICAL DATA:  Nausea, vomiting, fever and cough since yesterday. EXAM: CHEST - 2 VIEW COMPARISON:  None. FINDINGS: The heart size and mediastinal contours are within normal limits. Both lungs are clear. The visualized skeletal structures are unremarkable. IMPRESSION: No active cardiopulmonary disease. Electronically Signed   By: Tollie Ethavid  Kwon M.D.   On: 08/31/2017 02:11   Ct Head Wo Contrast  Result Date: 08/31/2017 CLINICAL DATA:  42 y/o M; headache, fever, lightheadedness, nausea, vomiting, and body aches since Monday. EXAM: CT HEAD WITHOUT CONTRAST TECHNIQUE: Contiguous axial images were obtained from the base of the skull through the vertex without intravenous contrast. COMPARISON:  None. FINDINGS: Brain: No evidence of acute infarction, hemorrhage, hydrocephalus, extra-axial collection or mass lesion/mass effect. Vascular: No hyperdense vessel or unexpected calcification. Skull: Normal. Negative for fracture or focal lesion. Sinuses/Orbits: No acute finding. Other: None. IMPRESSION: Negative CT of the head. No acute intracranial abnormality identified. Electronically Signed   By: Mitzi HansenLance  Furusawa-Stratton M.D.   On: 08/31/2017 04:30   Dg Lumbar Puncture Fluoro  Guide  Result Date: 08/31/2017 CLINICAL DATA:  Fever and headache, possible meningitis. EXAM: DIAGNOSTIC LUMBAR PUNCTURE UNDER FLUOROSCOPIC GUIDANCE FLUOROSCOPY TIME:  Fluoroscopy Time:  0 minutes, 30 seconds Radiation Exposure Index (if provided by the fluoroscopic device): 5.2 mGy Number of Acquired Spot Images: 0 PROCEDURE: I discussed the risks (including hemorrhage, infection, headache, and nerve damage, among others), benefits, and alternatives to fluoroscopically guided lumbar puncture with the patient. We specifically discussed the high technical likelihood of success of the procedure. The patient understood and elected to undergo the procedure. Standard time-out was employed. Following sterile skin prep and local anesthetic administration consisting of 1 percent lidocaine, a 22 gauge spinal needle was advanced without difficulty into the thecal sac at the at the L4-5 level. Clear CSF was returned. Opening pressure was 12  cm of water with the patient in the left lateral decubitus position. 13 cc of clear CSF was collected. The needle was subsequently removed and the skin cleansed and bandaged. No immediate complications were observed. IMPRESSION: Technically successful fluoroscopically guided lumbar puncture yielding 13 cc of clear CSF. Opening pressure was 12 cm water. Electronically Signed   By: Gaylyn Rong M.D.   On: 08/31/2017 14:37     Medications:  Scheduled: . insulin aspart  0-9 Units Subcutaneous TID WC  . lisinopril  20 mg Oral Daily   Continuous: . sodium chloride Stopped (09/01/17 0113)  . acyclovir 1,020 mg (09/01/17 1220)   ZHY:QMVHQI chloride, acetaminophen **OR** acetaminophen, hydrALAZINE, morphine injection, ondansetron **OR** ondansetron (ZOFRAN) IV  Assessment/Plan:    Acute viral meningitis Patient underwent lumbar puncture yesterday.  He had 131 WBC.  Lymphocyte predominant.  No organisms noted on Gram stain.  Patient's peripheral blood WBC was normal.   Fever has resolved.  This is likely viral meningitis.  Stop antibacterials.  Wait on HSV PCR.  Continue acyclovir for now.  Symptoms have significantly improved.  Essential hypertension Patient stopped taking his medications a few months ago when he changed over to a vegan diet.  Blood pressure remains poorly controlled despite improvement in his pain.  I think he needs antihypertensive treatment.  He used to be on lisinopril previously which he had tolerated well.  We will place him back on lisinopril.  Diabetes mellitus type 2 Patient stopped taking his metformin a few months ago when he converted to a vegan diet.  His blood glucose levels remain elevated.  His HbA1c 7.7.  He was told that he would benefit from going back on metformin.  This will be prescribed at discharge.  DVT Prophylaxis: SCDs    Code Status: Full code Family Communication: Discussed with the patient Disposition Plan: Management as outlined above.  Anticipate discharge in 1 to 2 days.    LOS: 1 day   Osvaldo Shipper  [facility] Pager [phone] 09/01/2017, 1:01 PM  If 7PM-7AM, please contact night-coverage at [email or web address], password [facility]

## 2017-09-02 LAB — GLUCOSE, CAPILLARY
GLUCOSE-CAPILLARY: 159 mg/dL — AB (ref 70–99)
GLUCOSE-CAPILLARY: 226 mg/dL — AB (ref 70–99)
Glucose-Capillary: 175 mg/dL — ABNORMAL HIGH (ref 70–99)

## 2017-09-02 MED ORDER — OXYCODONE-ACETAMINOPHEN 5-325 MG PO TABS
1.0000 | ORAL_TABLET | Freq: Four times a day (QID) | ORAL | Status: DC | PRN
  Administered 2017-09-02: 1 via ORAL
  Filled 2017-09-02: qty 1

## 2017-09-02 MED ORDER — LISINOPRIL 20 MG PO TABS: 20.0000 mg | ORAL_TABLET | Freq: Every day | ORAL | 0 refills | Status: DC

## 2017-09-02 MED ORDER — OXYCODONE-ACETAMINOPHEN 5-325 MG PO TABS: 1.0000 | ORAL_TABLET | Freq: Four times a day (QID) | ORAL | 0 refills | Status: DC | PRN

## 2017-09-02 MED ORDER — VALACYCLOVIR HCL 500 MG PO TABS
1000.0000 mg | ORAL_TABLET | Freq: Three times a day (TID) | ORAL | Status: DC
  Administered 2017-09-02: 1000 mg via ORAL
  Filled 2017-09-02: qty 2

## 2017-09-02 MED ORDER — VALACYCLOVIR HCL 1 G PO TABS: 1000.0000 mg | ORAL_TABLET | Freq: Three times a day (TID) | ORAL | 0 refills | Status: AC

## 2017-09-02 MED ORDER — METFORMIN HCL 500 MG PO TABS: 500.0000 mg | ORAL_TABLET | Freq: Two times a day (BID) | ORAL | 0 refills | Status: DC

## 2017-09-02 NOTE — Progress Notes (Signed)
Noah Bates Andra Fleury to be D/C'd Home per MD order.  Discussed prescriptions and follow up appointments with the patient. Prescriptions given to patient, medication list explained in detail. Pt verbalized understanding.  Allergies as of 09/02/2017   No Known Allergies     Medication List    TAKE these medications   lisinopril 20 MG tablet Commonly known as:  PRINIVIL,ZESTRIL Take 1 tablet (20 mg total) by mouth daily. Start taking on:  09/03/2017   metFORMIN 500 MG tablet Commonly known as:  GLUCOPHAGE Take 1 tablet (500 mg total) by mouth 2 (two) times daily with a meal.   oxyCODONE-acetaminophen 5-325 MG tablet Commonly known as:  PERCOCET/ROXICET Take 1 tablet by mouth every 6 (six) hours as needed for severe pain.   valACYclovir 1000 MG tablet Commonly known as:  VALTREX Take 1 tablet (1,000 mg total) by mouth 3 (three) times daily for 8 days.       Vitals:   09/02/17 0626 09/02/17 1400  BP: (!) 155/89 (!) 188/102  Pulse: 74 72  Resp: 16 18  Temp: 98.2 F (36.8 C)   SpO2: 97% 100%    Skin clean, dry and intact without evidence of skin break down, no evidence of skin tears noted. IV catheter discontinued intact. Site without signs and symptoms of complications. Dressing and pressure applied. Pt denies pain at this time. No complaints noted.  An After Visit Summary was printed and given to the patient. Patient escorted via WC, and D/C home via private auto.  Fara BorosSara Aaiden Depoy BSN, RN [employer] [facility] Phone [phone]

## 2017-09-02 NOTE — Discharge Instructions (Signed)
Viral Meningitis, Adult Viral meningitis is an infection of the tissues (meninges) that cover the brain and spinal cord. Many common viruses can cause viral meningitis. Most people with viral meningitis get better without treatment in about 10 days. However, it is important to be evaluated by your health care provider to make sure you do not have bacterial meningitis. Bacterial meningitis has similar symptoms, but it is much more dangerous and must be treated quickly with antibiotics. What are the causes? Many common viruses can cause viral meningitis, including:  Enteroviruses. These types of viruses are the most common cause of viral meningitis.  Herpes.  HIV (human immunodeficiency virus).  Measles.  Mumps.  Chicken pox (varicella-zoster).  Flu (influenza) viruses.  These viruses can be spread in different ways, such as through contact with:  Stool. This means that you could get sick by touching something that has been contaminated with infected stool and then touching your eyes, nose, or mouth.  Respiratory secretions. This means that you could get sick from coughs or sneezes of an infected person, similar to the spreading of the common cold.  Infected blood or infected bodily fluids.  Rodents.  Mosquito bites or tick bites.  When a virus enters your system, it can spread through the blood to reach the brain and spinal cord. What increases the risk? You may be at higher risk for meningitis if you have a weakened disease-fighting system (immune system). What are the signs or symptoms? Symptoms of viral meningitis may be similar to symptoms of a cold or flu. Signs and symptoms may include:  Fever.  Headache.  Stiff neck.  Muscle aches.  Nausea and vomiting.  Sensitivity to light.  Tiredness.  Cough.  How is this diagnosed? This condition may be diagnosed based on your symptoms, your medical history, and a physical exam. You may be asked to touch your chin to  your neck to see if this causes pain. You may have tests, such as:  Lumbar puncture. In this procedure, also called a spinal tap, a small amount of fluid from your spinal canal (cerebrospinal fluid) is removed and analyzed.  Blood tests.  Other fluid or tissue samples.  CT scan.  MRI.  How is this treated? Most types of viral meningitis go away without treatment. You may be given antibiotic medicine through an IV tube until your health care provider is sure that you do not have bacterial meningitis. The antibiotic will be stopped as soon as viral meningitis is diagnosed. Depending on the type of virus that caused your meningitis, you may be given:  Antiretroviral medicine.  Antiviral medicine.  Medicines that reduce fever and pain.  Medicines that reduce swelling (steroids).  Follow these instructions at home:  Take over-the-counter and prescription medicines only as told by your health care provider.  If you are taking a medicine for viral meningitis, do not stop taking the medicine even if you start to feel better.  Drink enough fluid to keep your urine clear or pale yellow.  Rest at home until you feel better. Return to your normal activities as told by your health care provider.  Keep all follow-up visits as told by your health care provider. This is important. How is this prevented?  Get a flu shot (influenza vaccination) every year. This will help prevent meningitis that is caused by flu viruses.  Wash your hands often with soap and water. If soap and water are not available, use hand sanitizer.  Avoid touching your hands to  your face when you have not washed your hands recently.  Avoid close contact with people who are sick.  Disinfect counters and other surfaces if someone in your home is sick.  Stay home while you are sick, and try to stay away from others as much as possible to avoid spreading the infection.  Cover your nose and mouth when you sneeze or  cough.  Use insect repellent to prevent mosquito bites. Contact a health care provider if:  Your symptoms do not improve after 7-10 days.  You have a fever that does not get better with medicine. Get help right away if:  Your symptoms get worse.  You become confused.  You become very sleepy. This information is not intended to replace advice given to you by your health care provider. Make sure you discuss any questions you have with your health care provider. Document Released: 06/16/2015 Document Revised: 07/31/2015 Document Reviewed: 04/28/2015 Elsevier Interactive Patient Education  [employer]2018 [employer]

## 2017-09-02 NOTE — Care Management Note (Addendum)
Case Management Note  Patient Details  Name: Noah Bates MRN: [id number] Date of Birth: [birth year]  Subjective/Objective:      SIRS. From home with family. Pt with no insurance, no PCP.            Action/Plan: Transition to home with post hospital [facility] , noted on AVS and explained to pt.  Pt states no problems affording Rx meds. Family to provide transportation to home.  Expected Discharge Date:  09/02/17               Expected Discharge Plan:  Home/Self Care  In-House Referral:     Discharge planning Services  CM Consult, Follow-up appt scheduled, [facility]  Post Acute Care Choice:    Choice offered to:     DME Arranged:   N/A DME Agency:   N/A  HH Arranged:   N/A HH Agency:   N/A  Status of Service:  Completed, signed off  If discussed at Long Length of Stay Meetings, dates discussed:    Additional Comments:  Epifanio LeschesCole, Jakiah Bienaime Hudson, RN 09/02/2017, 3:13 PM

## 2017-09-02 NOTE — Discharge Summary (Signed)
[facility]  Physician Discharge Summary   Patient ID: Noah Bates MRN: [id number] DOB/AGE: Dec 08, 1975 42 y.o.  Admit date: 08/30/2017 Discharge date: 09/02/2017  PCP: Patient, No Pcp Per  DISCHARGE DIAGNOSES:  Acute viral meningitis  RECOMMENDATIONS FOR OUTPATIENT FOLLOW UP: 1. HSV PCR is pending 2. Patient told that he needs close outpatient follow-up for hypertension and diabetes management  DISCHARGE CONDITION: fair  Diet recommendation: Modified carbohydrate  Filed Weights   08/31/17 0507  Weight: 102.1 kg (225 lb)    INITIAL HISTORY: 42 year old African-American male with a past medical history of essential hypertension and diabetes mellitus type 2 for which he no longer takes medications presented with fever headache.  Concern was for meningitis.  Patient was hospitalized.  Procedures:  Lumbar puncture   HOSPITAL COURSE:   Acute viral meningitis Patient presented with headache, fever and chills. Patient underwent lumbar puncture.  He had 131 WBC.  Lymphocyte predominant.  No organisms noted on Gram stain.  Patient's peripheral blood WBC was normal.  Fever has resolved.  This is likely viral meningitis.    He was empirically placed on vancomycin, ceftriaxone and acyclovir.  The antibacterials were discontinued first.  HSV PCR will take many days to result.  Patient is feeling much better.  Still has occasional headache but no fever.  Discussed with ID.  He will be discharged on valacyclovir.  CSF cultures negative so far.  Essential hypertension Patient stopped taking his medications a few months ago when he changed over to a vegan diet.  Blood pressure remains poorly controlled despite improvement in his pain.  I think he needs antihypertensive treatment.  He used to be on lisinopril previously which he had tolerated well.    He was placed back on lisinopril.  He will be given a prescription for same.  He will need to follow-up with her primary care  provider.  He understands all of this.  Diabetes mellitus type 2 Patient stopped taking his metformin a few months ago when he converted to a vegan diet.  His blood glucose levels remain elevated.  His HbA1c 7.7.  He was told that he would benefit from going back on metformin.  This will be prescribed at discharge.  Overall stable.  Okay for discharge today.    PERTINENT LABS:  The results of significant diagnostics from this hospitalization (including imaging, microbiology, ancillary and laboratory) are listed below for reference.    Microbiology: Recent Results (from the past 240 hour(s))  Blood culture (routine x 2)     Status: None (Preliminary result)   Collection Time: 08/31/17  2:30 AM  Result Value Ref Range Status   Specimen Description BLOOD RIGHT ANTECUBITAL  Final   Special Requests   Final    BOTTLES DRAWN AEROBIC AND ANAEROBIC Blood Culture results may not be optimal due to an inadequate volume of blood received in culture bottles   Culture   Final    NO GROWTH 2 DAYS Performed at [facility], [address] [place], [place] [address]    Report Status PENDING  Incomplete  Group A Strep by PCR     Status: None   Collection Time: 08/31/17  2:55 AM  Result Value Ref Range Status   Group A Strep by PCR NOT DETECTED NOT DETECTED Final    Comment: Performed at [facility], [address] [place], [place] [address]  Blood culture (routine x 2)     Status: None (Preliminary result)  Collection Time: 08/31/17  3:00 AM  Result Value Ref Range Status   Specimen Description BLOOD RIGHT HAND  Final   Special Requests   Final    BOTTLES DRAWN AEROBIC AND ANAEROBIC Blood Culture adequate volume   Culture   Final    NO GROWTH 2 DAYS Performed at [facility], [address] [place], [place] [address]    Report Status PENDING  Incomplete  CSF culture     Status: None (Preliminary result)   Collection Time: 08/31/17  1:59 PM  Result Value Ref Range  Status   Specimen Description CSF  Final   Special Requests NONE  Final   Gram Stain   Final    WBC PRESENT,BOTH PMN AND MONONUCLEAR NO ORGANISMS SEEN CYTOSPIN SMEAR    Culture   Final    NO GROWTH 1 DAY Performed at [facility], [address] [place], [place] [address]    Report Status PENDING  Incomplete     Labs: Basic Metabolic Panel: Recent Labs  Lab 08/30/17 2324 09/01/17 0605  NA 140 140  K 3.7 3.9  CL 104 107  CO2 26 26  GLUCOSE 163* 173*  BUN 7 10  CREATININE 0.90 0.72  CALCIUM 9.2 8.7*   Liver Function Tests: Recent Labs  Lab 08/30/17 2324 09/01/17 0605  AST 19 14*  ALT 17 14  ALKPHOS 54 40  BILITOT 2.3* 1.4*  PROT 7.5 5.8*  ALBUMIN 4.5 3.5   Recent Labs  Lab 08/30/17 2324  LIPASE 25   CBC: Recent Labs  Lab 08/30/17 2324 09/01/17 0605  WBC 6.0 4.9  NEUTROABS  --  2.6  HGB 13.6 12.3*  HCT 42.3 37.4*  MCV 82.0 79.9  PLT 193 151    CBG: Recent Labs  Lab 09/01/17 1732 09/01/17 2106 09/02/17 0003 09/02/17 0728 09/02/17 1202  GLUCAP 183* 225* 226* 159* 175*     IMAGING STUDIES Dg Chest 2 View  Result Date: 08/31/2017 CLINICAL DATA:  Nausea, vomiting, fever and cough since yesterday. EXAM: CHEST - 2 VIEW COMPARISON:  None. FINDINGS: The heart size and mediastinal contours are within normal limits. Both lungs are clear. The visualized skeletal structures are unremarkable. IMPRESSION: No active cardiopulmonary disease. Electronically Signed   By: Tollie Eth M.D.   On: 08/31/2017 02:11   Ct Head Wo Contrast  Result Date: 08/31/2017 CLINICAL DATA:  42 y/o M; headache, fever, lightheadedness, nausea, vomiting, and body aches since Monday. EXAM: CT HEAD WITHOUT CONTRAST TECHNIQUE: Contiguous axial images were obtained from the base of the skull through the vertex without intravenous contrast. COMPARISON:  None. FINDINGS: Brain: No evidence of acute infarction, hemorrhage, hydrocephalus, extra-axial collection or mass lesion/mass  effect. Vascular: No hyperdense vessel or unexpected calcification. Skull: Normal. Negative for fracture or focal lesion. Sinuses/Orbits: No acute finding. Other: None. IMPRESSION: Negative CT of the head. No acute intracranial abnormality identified. Electronically Signed   By: Mitzi Hansen M.D.   On: 08/31/2017 04:30   Dg Lumbar Puncture Fluoro Guide  Result Date: 08/31/2017 CLINICAL DATA:  Fever and headache, possible meningitis. EXAM: DIAGNOSTIC LUMBAR PUNCTURE UNDER FLUOROSCOPIC GUIDANCE FLUOROSCOPY TIME:  Fluoroscopy Time:  0 minutes, 30 seconds Radiation Exposure Index (if provided by the fluoroscopic device): 5.2 mGy Number of Acquired Spot Images: 0 PROCEDURE: I discussed the risks (including hemorrhage, infection, headache, and nerve damage, among others), benefits, and alternatives to fluoroscopically guided lumbar puncture with the patient. We specifically discussed the high technical likelihood of success  of the procedure. The patient understood and elected to undergo the procedure. Standard time-out was employed. Following sterile skin prep and local anesthetic administration consisting of 1 percent lidocaine, a 22 gauge spinal needle was advanced without difficulty into the thecal sac at the at the L4-5 level. Clear CSF was returned. Opening pressure was 12 cm of water with the patient in the left lateral decubitus position. 13 cc of clear CSF was collected. The needle was subsequently removed and the skin cleansed and bandaged. No immediate complications were observed. IMPRESSION: Technically successful fluoroscopically guided lumbar puncture yielding 13 cc of clear CSF. Opening pressure was 12 cm water. Electronically Signed   By: Gaylyn Rong M.D.   On: 08/31/2017 14:37    DISCHARGE EXAMINATION: Vitals:   09/01/17 1352 09/01/17 2109 09/02/17 0626 09/02/17 1400  BP: (!) 161/80 (!) 182/95 (!) 155/89 (!) 188/102  Pulse: 78 75 74 72  Resp: 18 17 16 18   Temp: 97.8 F (36.6  C) 98 F (36.7 C) 98.2 F (36.8 C)   TempSrc: Oral Oral Oral   SpO2: 99% 97% 97% 100%  Weight:      Height:       General appearance: alert, cooperative, appears stated age and no distress Resp: clear to auscultation bilaterally Cardio: regular rate and rhythm, S1, S2 normal, no murmur, click, rub or gallop GI: soft, non-tender; bowel sounds normal; no masses,  no organomegaly Extremities: extremities normal, atraumatic, no cyanosis or edema  DISPOSITION: Home  Discharge Instructions    Call MD for:  difficulty breathing, headache or visual disturbances   Complete by:  As directed    Call MD for:  extreme fatigue   Complete by:  As directed    Call MD for:  hives   Complete by:  As directed    Call MD for:  persistant dizziness or light-headedness   Complete by:  As directed    Call MD for:  persistant nausea and vomiting   Complete by:  As directed    Call MD for:  severe uncontrolled pain   Complete by:  As directed    Call MD for:  temperature >100.4   Complete by:  As directed    Diet Carb Modified   Complete by:  As directed    Discharge instructions   Complete by:  As directed    Please take your medications as prescribed.  Please establish with a primary care provider for further management of hypertension and diabetes.  Immediately if fevers recur.  Take Tylenol or Motrin for headaches.  You were cared for by a hospitalist during your hospital stay. If you have any questions about your discharge medications or the care you received while you were in the hospital after you are discharged, you can call the unit and asked to speak with the hospitalist on call if the hospitalist that took care of you is not available. Once you are discharged, your primary care physician will handle any further medical issues. Please note that NO REFILLS for any discharge medications will be authorized once you are discharged, as it is imperative that you return to your primary care physician  (or establish a relationship with a primary care physician if you do not have one) for your aftercare needs so that they can reassess your need for medications and monitor your lab values. If you do not have a primary care physician, you can call [phone] for a physician referral.   Increase activity slowly  Complete by:  As directed         Allergies as of 09/02/2017   No Known Allergies     Medication List    TAKE these medications   lisinopril 20 MG tablet Commonly known as:  PRINIVIL,ZESTRIL Take 1 tablet (20 mg total) by mouth daily. Start taking on:  09/03/2017   metFORMIN 500 MG tablet Commonly known as:  GLUCOPHAGE Take 1 tablet (500 mg total) by mouth 2 (two) times daily with a meal.   oxyCODONE-acetaminophen 5-325 MG tablet Commonly known as:  PERCOCET/ROXICET Take 1 tablet by mouth every 6 (six) hours as needed for severe pain.   valACYclovir 1000 MG tablet Commonly known as:  VALTREX Take 1 tablet (1,000 mg total) by mouth 3 (three) times daily for 8 days.        Follow-up Information    [facility]. Go on 09/23/2017.   Specialty:  Internal Medicine Why:    1pm, post hospital follow up appointment Contact information: [address] [name] [address] [phone]          TOTAL DISCHARGE TIME: 35 mins  Osvaldo Shipper  [facility] Pager [phone]  09/02/2017, 2:59 PM

## 2017-09-02 NOTE — Progress Notes (Signed)
Results for Vassie LollFREEMAN, Deniz ANDRA (MRN [id number]) as of 09/02/2017 00:08  Ref. Range 09/01/2017 21:06  Glucose-Capillary Latest Ref Range: 70 - 99 mg/dL 409225 (H)    No insulin coverage ordered. Night provider, Bodenheimer--NP, notified of above CBG. Orders placed for one time dose Insulin, per MAR. Will continue to monitor.

## 2017-09-04 LAB — CSF CULTURE W GRAM STAIN: Culture: NO GROWTH

## 2017-09-05 LAB — HERPES SIMPLEX VIRUS(HSV) DNA BY PCR
HSV 1 DNA: NEGATIVE
HSV 2 DNA: NEGATIVE

## 2017-09-05 LAB — CULTURE, BLOOD (ROUTINE X 2)
CULTURE: NO GROWTH
CULTURE: NO GROWTH
SPECIAL REQUESTS: ADEQUATE

## 2017-09-23 ENCOUNTER — Ambulatory Visit (INDEPENDENT_AMBULATORY_CARE_PROVIDER_SITE_OTHER): Payer: Self-pay | Admitting: Family Medicine

## 2017-09-23 ENCOUNTER — Encounter: Payer: Self-pay | Admitting: Family Medicine

## 2017-09-23 VITALS — BP 152/84 | HR 78 | Temp 97.6°F | Ht 79.0 in | Wt 236.0 lb

## 2017-09-23 DIAGNOSIS — R8290 Unspecified abnormal findings in urine: Secondary | ICD-10-CM

## 2017-09-23 DIAGNOSIS — E119 Type 2 diabetes mellitus without complications: Secondary | ICD-10-CM

## 2017-09-23 DIAGNOSIS — E1165 Type 2 diabetes mellitus with hyperglycemia: Secondary | ICD-10-CM

## 2017-09-23 DIAGNOSIS — Z23 Encounter for immunization: Secondary | ICD-10-CM

## 2017-09-23 DIAGNOSIS — I10 Essential (primary) hypertension: Secondary | ICD-10-CM

## 2017-09-23 DIAGNOSIS — Z09 Encounter for follow-up examination after completed treatment for conditions other than malignant neoplasm: Secondary | ICD-10-CM

## 2017-09-23 LAB — POCT GLYCOSYLATED HEMOGLOBIN (HGB A1C): Hemoglobin A1C: 7.3 % — AB (ref 4.0–5.6)

## 2017-09-23 LAB — POCT URINALYSIS DIP (MANUAL ENTRY)
Bilirubin, UA: NEGATIVE
Glucose, UA: 100 mg/dL — AB
Leukocytes, UA: NEGATIVE
Nitrite, UA: NEGATIVE
Protein Ur, POC: 30 mg/dL — AB
Spec Grav, UA: >=1.03 — AB (ref 1.010–1.025)
Urobilinogen, UA: 0.2 E.U./dL
pH, UA: 5 (ref 5.0–8.0)

## 2017-09-23 MED ORDER — LISINOPRIL 20 MG PO TABS: 20.0000 mg | ORAL_TABLET | Freq: Every day | ORAL | 2 refills | Status: DC

## 2017-09-23 MED ORDER — METFORMIN HCL 500 MG PO TABS: 500.0000 mg | ORAL_TABLET | Freq: Two times a day (BID) | ORAL | 2 refills | Status: DC

## 2017-09-23 NOTE — Progress Notes (Addendum)
New Patient-Establish Care  Subjective:    Patient ID: Noah Bates, male    DOB: 07/29/1975, 42 y.o.   MRN: [id number]   PCP: Noah Ip, NP  Chief Complaint  Patient presents with  . Establish Care  . Hospitalization Follow-up    HPI  Noah Bates has a past medical history of Hypertension and Diabetes. He is here today to establish care. He is accompanied today by his wife.   Current Status: He recently re-located to [place], from [place], [place]. He is doing well with no complaints. He denies fevers, chills, fatigue, recent infections, weight loss, and night sweats. She has not had any headaches, visual changes, dizziness, and falls. No chest pain, heart palpitations, cough and shortness of breath reported. No reports of GI problems such as nausea, vomiting, diarrhea, and constipation. She has no reports of blood in stools, dysuria and hematuria. No depression or anxiety, and denies suicidal ideations, homicidal ideations, or auditory hallucinations. She denies pain today.    Current Status: Since his last office visit, he is doing well with no complaints. He denies fevers, chills, fatigue, recent infections, weight loss, and night sweats. He does report occasional headache. He has not had any visual changes, dizziness, and falls. No chest pain, heart palpitations, cough and shortness of breath reported. No reports of GI problems such as nausea, vomiting, diarrhea, and constipation. He has no reports of blood in stools, dysuria and hematuria. No depression or anxiety, and denies suicidal ideations, homicidal ideations, or auditory hallucinations. He denies pain today. 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   Past Medical History:  Diagnosis Date  . Diabetes mellitus without complication (HCC)   . Hypertension     Family History  Problem Relation Age of Onset  . Diabetes Mellitus II Mother   . Diabetes Mellitus II Father     Social History   Socioeconomic History   . Marital status: Married    Spouse name: Not on file  . Number of children: Not on file  . Years of education: Not on file  . Highest education level: Not on file  Occupational History  . Not on file  Social Needs  . Financial resource strain: Not on file  . Food insecurity:    Worry: Not on file    Inability: Not on file  . Transportation needs:    Medical: Not on file    Non-medical: Not on file  Tobacco Use  . Smoking status: Never Smoker  . Smokeless tobacco: Never Used  Substance and Sexual Activity  . Alcohol use: Not Currently  . Drug use: Never  . Sexual activity: Not on file  Lifestyle  . Physical activity:    Days per week: Not on file    Minutes per session: Not on file  . Stress: Not on file  Relationships  . Social connections:    Talks on phone: Not on file    Gets together: Not on file    Attends religious service: Not on file    Active member of club or organization: Not on file    Attends meetings of clubs or organizations: Not on file    Relationship status: Not on file  . Intimate partner violence:    Fear of current or ex partner: Not on file    Emotionally abused: Not on file    Physically abused: Not on file    Forced sexual activity: Not on file  Other Topics Concern  . Not on  file  Social History Narrative  . Not on file    Past Surgical History:  Procedure Laterality Date  . BACK SURGERY      Immunization History  Administered Date(s) Administered  . Tdap 09/23/2017      No Known Allergies  BP (!) 152/84   Pulse 78   Temp 97.6 F (36.4 C) (Oral)   Ht 6\' 7"  (2.007 m)   Wt 236 lb (107 kg)   SpO2 100%   BMI 26.59 kg/m   Review of Systems  Constitutional: Negative.   HENT: Negative.   Eyes: Negative.   Respiratory: Negative.   Cardiovascular: Negative.   Gastrointestinal: Negative.   Endocrine: Negative.   Genitourinary: Negative.   Musculoskeletal: Negative.   Skin: Negative.   Allergic/Immunologic: Negative.    Neurological: Positive for headaches.  Hematological: Negative.   Psychiatric/Behavioral: Negative.    Objective:   Physical Exam  Constitutional: He is oriented to person, place, and time. He appears well-developed and well-nourished.  HENT:  Head: Normocephalic and atraumatic.  Right Ear: External ear normal.  Left Ear: External ear normal.  Nose: Nose normal.  Mouth/Throat: Oropharynx is clear and moist.  Eyes: Pupils are equal, round, and reactive to light. Conjunctivae and EOM are normal.  Neck: Normal range of motion. Neck supple.  Cardiovascular: Normal rate, regular rhythm, normal heart sounds and intact distal pulses.  Pulmonary/Chest: Effort normal and breath sounds normal.  Abdominal: Bowel sounds are normal.  Musculoskeletal: Normal range of motion.  Feet:  Right Foot:  Protective Sensation: 1 site tested. 1 site sensed. Left Foot:  Protective Sensation: 1 site tested. 1 site sensed. Neurological: He is alert and oriented to person, place, and time.  Skin: Skin is warm and dry. Capillary refill takes less than 2 seconds.  Psychiatric: He has a normal mood and affect. His behavior is normal. Judgment and thought content normal.  Nursing note and vitals reviewed.  Assessment & Plan:   1. Type 2 diabetes mellitus with hyperglycemia, without long-term current use of insulin (HCC) Hgb A1c has decreased to 7.3 today, from 7.7 three weeks ago. He will continue to decrease foods/beverages high in sugars and carbs and follow Heart Healthy or DASH diet. Increase physical activity to at least 30 minutes cardio exercise daily.   - POCT glycosylated hemoglobin (Hb A1C) - POCT urinalysis dipstick  2. Abnormal urinalysis - Urine Culture  3. Need for Tdap vaccination - Tdap vaccine greater than or equal to 7yo IM  4. Encounter for diabetic foot exam (HCC) All sites positive for sensitivity.   5. Essential hypertension Blood pressure is 158/84. Continue Lisinopril as  prescribed. We will re-assess blood pressure and possible increase in dosage at next office visit.   6. Follow up He will follow up in 3 months.   Meds ordered this encounter  Medications  . lisinopril (PRINIVIL,ZESTRIL) 20 MG tablet    Sig: Take 1 tablet (20 mg total) by mouth daily.    Dispense:  30 tablet    Refill:  2  . metFORMIN (GLUCOPHAGE) 500 MG tablet    Sig: Take 1 tablet (500 mg total) by mouth 2 (two) times daily with a meal.    Dispense:  60 tablet    Refill:  2    Noah IpNatalie Tylasia Fletchall,  MSN, FNP-C [facility] [address]  [place], [place] [address] [phone]

## 2017-09-25 LAB — URINE CULTURE: Organism ID, Bacteria: NO GROWTH

## 2017-12-26 ENCOUNTER — Ambulatory Visit: Payer: Self-pay | Admitting: Family Medicine

## 2018-01-08 ENCOUNTER — Other Ambulatory Visit: Payer: Self-pay

## 2018-01-08 ENCOUNTER — Encounter (HOSPITAL_COMMUNITY): Payer: Self-pay | Admitting: Emergency Medicine

## 2018-01-08 ENCOUNTER — Emergency Department (HOSPITAL_COMMUNITY)
Admission: EM | Admit: 2018-01-08 | Discharge: 2018-01-08 | Disposition: A | Discharge status: Home / Self Care | Payer: Self-pay | Attending: Emergency Medicine | Admitting: Emergency Medicine

## 2018-01-08 ENCOUNTER — Emergency Department (HOSPITAL_COMMUNITY): Payer: Self-pay

## 2018-01-08 DIAGNOSIS — Z7984 Long term (current) use of oral hypoglycemic drugs: Secondary | ICD-10-CM | POA: Insufficient documentation

## 2018-01-08 DIAGNOSIS — I10 Essential (primary) hypertension: Secondary | ICD-10-CM | POA: Insufficient documentation

## 2018-01-08 DIAGNOSIS — Z79899 Other long term (current) drug therapy: Secondary | ICD-10-CM | POA: Insufficient documentation

## 2018-01-08 DIAGNOSIS — E119 Type 2 diabetes mellitus without complications: Secondary | ICD-10-CM | POA: Insufficient documentation

## 2018-01-08 DIAGNOSIS — R51 Headache: Secondary | ICD-10-CM | POA: Insufficient documentation

## 2018-01-08 DIAGNOSIS — R519 Headache, unspecified: Secondary | ICD-10-CM

## 2018-01-08 DIAGNOSIS — Z7982 Long term (current) use of aspirin: Secondary | ICD-10-CM | POA: Insufficient documentation

## 2018-01-08 LAB — URINALYSIS, ROUTINE W REFLEX MICROSCOPIC
Bacteria, UA: NONE SEEN
Bilirubin Urine: NEGATIVE
GLUCOSE, UA: 50 mg/dL — AB
Ketones, ur: 20 mg/dL — AB
Leukocytes, UA: NEGATIVE
Nitrite: NEGATIVE
PROTEIN: NEGATIVE mg/dL
Specific Gravity, Urine: 1.013 (ref 1.005–1.030)
pH: 7 (ref 5.0–8.0)

## 2018-01-08 LAB — COMPREHENSIVE METABOLIC PANEL
ALBUMIN: 4.2 g/dL (ref 3.5–5.0)
ALK PHOS: 48 U/L (ref 38–126)
ALT: 14 U/L (ref 0–44)
AST: 16 U/L (ref 15–41)
Anion gap: 8 (ref 5–15)
BILIRUBIN TOTAL: 1.6 mg/dL — AB (ref 0.3–1.2)
BUN: 9 mg/dL (ref 6–20)
CALCIUM: 9.2 mg/dL (ref 8.9–10.3)
CO2: 27 mmol/L (ref 22–32)
Chloride: 105 mmol/L (ref 98–111)
Creatinine, Ser: 0.77 mg/dL (ref 0.61–1.24)
GFR calc Af Amer: >60 mL/min (ref >60)
GFR calc non Af Amer: >60 mL/min (ref >60)
GLUCOSE: 169 mg/dL — AB (ref 70–99)
POTASSIUM: 4 mmol/L (ref 3.5–5.1)
Sodium: 140 mmol/L (ref 135–145)
TOTAL PROTEIN: 6.4 g/dL — AB (ref 6.5–8.1)

## 2018-01-08 LAB — CBC WITH DIFFERENTIAL/PLATELET
Abs Immature Granulocytes: 0.01 10*3/uL (ref 0.00–0.07)
BASOS ABS: 0 10*3/uL (ref 0.0–0.1)
Basophils Relative: 0 %
Eosinophils Absolute: 0 10*3/uL (ref 0.0–0.5)
Eosinophils Relative: 0 %
HEMATOCRIT: 41.3 % (ref 39.0–52.0)
HEMOGLOBIN: 13.2 g/dL (ref 13.0–17.0)
Immature Granulocytes: 0 %
LYMPHS ABS: 0.8 10*3/uL (ref 0.7–4.0)
LYMPHS PCT: 12 %
MCH: 26 pg (ref 26.0–34.0)
MCHC: 32 g/dL (ref 30.0–36.0)
MCV: 81.3 fL (ref 80.0–100.0)
Monocytes Absolute: 0.3 10*3/uL (ref 0.1–1.0)
Monocytes Relative: 4 %
NEUTROS ABS: 5.4 10*3/uL (ref 1.7–7.7)
NEUTROS PCT: 84 %
NRBC: 0 % (ref 0.0–0.2)
Platelets: 179 10*3/uL (ref 150–400)
RBC: 5.08 MIL/uL (ref 4.22–5.81)
RDW: 12.2 % (ref 11.5–15.5)
WBC: 6.4 10*3/uL (ref 4.0–10.5)

## 2018-01-08 MED ORDER — SODIUM CHLORIDE 0.9 % IV BOLUS
1000.0000 mL | Freq: Once | INTRAVENOUS | Status: AC
  Administered 2018-01-08: 1000 mL via INTRAVENOUS

## 2018-01-08 MED ORDER — DIPHENHYDRAMINE HCL 50 MG/ML IJ SOLN
25.0000 mg | Freq: Once | INTRAMUSCULAR | Status: AC
  Administered 2018-01-08: 25 mg via INTRAVENOUS
  Filled 2018-01-08: qty 1

## 2018-01-08 MED ORDER — METOCLOPRAMIDE HCL 5 MG/ML IJ SOLN
10.0000 mg | Freq: Once | INTRAMUSCULAR | Status: AC
  Administered 2018-01-08: 10 mg via INTRAVENOUS
  Filled 2018-01-08: qty 2

## 2018-01-08 NOTE — ED Notes (Signed)
Pt discharged from ED; instructions provided and scripts given; Pt encouraged to return to ED if symptoms worsen and to f/u with PCP; Pt verbalized understanding of all instructions 

## 2018-01-08 NOTE — ED Provider Notes (Signed)
[facility] EMERGENCY DEPARTMENT Provider Note   CSN: [id number] Arrival date & time: 01/08/18  0553     History   Chief Complaint Chief Complaint  Patient presents with  . Headache    HPI Noah Bates is a 42 y.o. male.  42 y.o male with a PMH of DM,HTN, SIRS presents to the ED with a chief complaint of headache x 1 day.  He reports he was at the grocery store yesterday and when he received a text message on his phone and went ahead to look at a text message and began to have blurry vision along with headache.He reports the pain is located on the right side of the back of his head radiating to the front of his head. He reports the pain is worse laying flat, and movement.  He has tried Aleve, Percocet and reports no relieving symptoms.  He also reports some photophobia along with nausea and vomiting, he has had 5 episodes of vomiting this morning.  She was admitted for a similar complaint in June 2019, was diagnosed with viral meningitis. He is not on blood thinners.  He denies any fever, IV drug use, recent travel, or trauma.      Past Medical History:  Diagnosis Date  . Diabetes mellitus without complication (HCC)   . Hypertension     Patient Active Problem List   Diagnosis Date Noted  . SIRS (systemic inflammatory response syndrome) (HCC) 08/31/2017  . Essential hypertension 08/31/2017  . Type 2 diabetes mellitus with hyperglycemia (HCC) 08/31/2017    Past Surgical History:  Procedure Laterality Date  . BACK SURGERY          Home Medications    Prior to Admission medications   Medication Sig Start Date End Date Taking? Authorizing Provider  aspirin EC 81 MG tablet Take 81 mg by mouth daily.   Yes [provider]  lisinopril (PRINIVIL,ZESTRIL) 20 MG tablet Take 1 tablet (20 mg total) by mouth daily. 09/23/17  Yes Kallie Locks, FNP  metFORMIN (GLUCOPHAGE) 500 MG tablet Take 1 tablet (500 mg total) by mouth 2 (two) times daily  with a meal. 09/23/17  Yes Kallie Locks, FNP  naproxen sodium (ALEVE) 220 MG tablet Take 440 mg by mouth daily as needed (for headache).   Yes [provider]  oxyCODONE-acetaminophen (PERCOCET/ROXICET) 5-325 MG tablet Take 1 tablet by mouth every 6 (six) hours as needed for severe pain. Patient not taking: Reported on 09/23/2017 09/02/17   Osvaldo Shipper, MD    Family History Family History  Problem Relation Age of Onset  . Diabetes Mellitus II Mother   . Diabetes Mellitus II Father     Social History Social History   Tobacco Use  . Smoking status: Never Smoker  . Smokeless tobacco: Never Used  Substance Use Topics  . Alcohol use: Not Currently  . Drug use: Never     Allergies   Patient has no known allergies.   Review of Systems Review of Systems  Constitutional: Negative for chills and fever.  HENT: Negative for sinus pressure, sinus pain and sore throat.   Respiratory: Negative for chest tightness and shortness of breath.   Cardiovascular: Negative for chest pain.  Gastrointestinal: Positive for nausea and vomiting. Negative for abdominal pain and constipation.  Genitourinary: Negative for dysuria and flank pain.  Musculoskeletal: Positive for neck pain. Negative for back pain and neck stiffness.  Skin: Negative for pallor and wound.  Neurological: Positive for weakness (  complaints of left sided weakness but has 5/5 strenght on exam) and headaches.     Physical Exam Updated Vital Signs BP (!) 180/87   Pulse 98   Temp 98.1 F (36.7 C) (Oral)   Resp 18   SpO2 100%   Physical Exam  Constitutional: He is oriented to person, place, and time. He appears well-developed and well-nourished.  HENT:  Head: Normocephalic and atraumatic.  Nose: Right sinus exhibits frontal sinus tenderness. Right sinus exhibits no maxillary sinus tenderness. Left sinus exhibits frontal sinus tenderness. Left sinus exhibits no maxillary sinus tenderness.  Neck: Normal range  of motion. Neck supple. No neck rigidity. No edema, no erythema and normal range of motion present. No Brudzinski's sign and no Kernig's sign noted.  No meningismus. Describes some pain with neck movement but has full ROM with extension and flexion.   Cardiovascular: Normal rate.  Pulmonary/Chest: Effort normal. He has no decreased breath sounds. He has no wheezes.  Adventitious sounds throughout all lung fields.   Abdominal: Soft.  Neurological: He is alert and oriented to person, place, and time. He has normal strength. He displays a negative Romberg sign. Gait normal.  Alert, oriented, thought content appropriate. Speech fluent without evidence of aphasia. Able to follow 2 step commands without difficulty.  Cranial Nerves:  II:  Peripheral visual fields grossly normal, pupils, round, reactive to light III,IV, VI: ptosis not present, extra-ocular motions intact bilaterally  V,VII: smile symmetric, facial light touch sensation equal VIII: hearing grossly normal bilaterally  IX,X: midline uvula rise  XI: bilateral shoulder shrug equal and strong XII: midline tongue extension  Motor:  5/5 in upper and lower extremities bilaterally including strong and equal grip strength and dorsiflexion/plantar flexion Sensory: light touch normal in all extremities.  Cerebellar: normal finger-to-nose with bilateral upper extremities, pronator drift negative Gait: normal gait and balance   Skin: Skin is warm and dry.  Nursing note and vitals reviewed.    ED Treatments / Results  Labs (all labs ordered are listed, but only abnormal results are displayed) Labs Reviewed  COMPREHENSIVE METABOLIC PANEL - Abnormal; Notable for the following components:      Result Value   Glucose, Bld 169 (*)    Total Protein 6.4 (*)    Total Bilirubin 1.6 (*)    All other components within normal limits  URINALYSIS, ROUTINE W REFLEX MICROSCOPIC - Abnormal; Notable for the following components:   Glucose, UA 50 (*)     Hgb urine dipstick SMALL (*)    Ketones, ur 20 (*)    All other components within normal limits  CBC WITH DIFFERENTIAL/PLATELET    EKG None  Radiology Ct Head Wo Contrast  Result Date: 01/08/2018 CLINICAL DATA:  Severe headache with nausea vomiting. EXAM: CT HEAD WITHOUT CONTRAST TECHNIQUE: Contiguous axial images were obtained from the base of the skull through the vertex without intravenous contrast. COMPARISON:  08/31/2017 FINDINGS: Brain: No evidence of acute infarction, hemorrhage, hydrocephalus, extra-axial collection or mass lesion/mass effect. Vascular: No hyperdense vessel or unexpected calcification. Skull: Normal. Negative for fracture or focal lesion. Sinuses/Orbits: Minimal opacification of the ethmoid sinuses. 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 IMPRESSION: No acute intracranial abnormality. Minimal ethmoid sinusitis. Electronically Signed   By: Ted Mcalpine M.D.   On: 01/08/2018 07:54    Procedures Procedures (including critical care time)  Medications Ordered in ED Medications  sodium chloride 0.9 % bolus 1,000 mL (0 mLs Intravenous Stopped 01/08/18 0859)  diphenhydrAMINE (BENADRYL) injection 25  mg (25 mg Intravenous Given 01/08/18 0759)  metoCLOPramide (REGLAN) injection 10 mg (10 mg Intravenous Given 01/08/18 0759)     Initial Impression / Assessment and Plan / ED Course  I have reviewed the triage vital signs and the nursing notes.  Pertinent labs & imaging results that were available during my care of the patient were reviewed by me and considered in my medical decision making (see chart for details).    Patient presents with HA which is recurrent for him since diagnosed with viral meningitis in June. He has not had any follow up for his headaches since his diagnose in June. During examination he is neurologically intact intact but due to his suspicious story along with >2 episodes of vomiting per [place] CT head will order CT head to r/o  any acute abnormality.    During his ED visit CMP showed no electrolyte abnormality, creatinine is 0.77, insistent with his previous visits.  CBC showed no leukocytosis, hemoglobin is stable.  Urinalysis shows small amount of blood in his urine but the patient is not complaining of any urinary symptoms at this time.  Upon reexamination of patient he reports feeling much better after headache cocktail that was provided in the ED.  CT head without contrast showed no acute intracranial abnormality, negative for infarction, hemorrhage, hydrocephalus, mass.  Minimal ethmoid sinusitis. At this time will discharge patient with close outpatient follow up along with neurology follow up. He may take ibuprofen or tylenol for pain.   Final Clinical Impressions(s) / ED Diagnoses   Final diagnoses:  Bad headache    ED Discharge Orders    None       Claude Manges, PA-C 01/08/18 1029    Benjiman Core, MD 01/08/18 1553

## 2018-01-08 NOTE — ED Triage Notes (Signed)
Pt endorses severe headaches with nausea and vomiting with increasing frequency since he had meningitis a few months ago.  Has not seen a physician for these headache. Pt is actively vomiting in triage. Light and sound sensitive. Pt states this episode began yesterday (Saturday)

## 2018-01-08 NOTE — Discharge Instructions (Addendum)
All your imaging today was normal. I have provided a referral for neurology, schedule an appointment as needed.  You may continue to take ibuprofen or Tylenol for pain.  Please follow-up with your PCP in 1 week for evaluation of symptoms.  If you experience fever, worsening neck pain, chest pain or shortness of breath he will return to the ER for reevaluation.

## 2018-01-17 ENCOUNTER — Ambulatory Visit: Payer: Self-pay | Admitting: Family Medicine

## 2018-01-27 ENCOUNTER — Ambulatory Visit (INDEPENDENT_AMBULATORY_CARE_PROVIDER_SITE_OTHER): Payer: Self-pay | Admitting: Family Medicine

## 2018-01-27 ENCOUNTER — Encounter: Payer: Self-pay | Admitting: Family Medicine

## 2018-01-27 VITALS — BP 166/84 | HR 78 | Temp 97.8°F | Ht 79.0 in | Wt 240.6 lb

## 2018-01-27 DIAGNOSIS — Z09 Encounter for follow-up examination after completed treatment for conditions other than malignant neoplasm: Secondary | ICD-10-CM

## 2018-01-27 DIAGNOSIS — G43919 Migraine, unspecified, intractable, without status migrainosus: Secondary | ICD-10-CM

## 2018-01-27 DIAGNOSIS — I10 Essential (primary) hypertension: Secondary | ICD-10-CM

## 2018-01-27 DIAGNOSIS — Z23 Encounter for immunization: Secondary | ICD-10-CM

## 2018-01-27 DIAGNOSIS — E1165 Type 2 diabetes mellitus with hyperglycemia: Secondary | ICD-10-CM

## 2018-01-27 LAB — POCT GLYCOSYLATED HEMOGLOBIN (HGB A1C): Hemoglobin A1C: 7.2 % — AB (ref 4.0–5.6)

## 2018-01-27 LAB — POCT URINALYSIS DIP (MANUAL ENTRY)
Bilirubin, UA: NEGATIVE
Glucose, UA: 100 mg/dL — AB
Ketones, POC UA: NEGATIVE mg/dL
Leukocytes, UA: NEGATIVE
Nitrite, UA: NEGATIVE
Protein Ur, POC: 30 mg/dL — AB
Spec Grav, UA: >=1.03 — AB (ref 1.010–1.025)
Urobilinogen, UA: 1 E.U./dL
pH, UA: 5 (ref 5.0–8.0)

## 2018-01-27 MED ORDER — CLONIDINE HCL 0.1 MG PO TABS
0.2000 mg | ORAL_TABLET | Freq: Once | ORAL | Status: AC
  Administered 2018-01-27: 0.2 mg via ORAL

## 2018-01-27 MED ORDER — LISINOPRIL 20 MG PO TABS: 20.0000 mg | ORAL_TABLET | Freq: Every day | ORAL | 2 refills | Status: DC

## 2018-01-27 MED ORDER — AMLODIPINE BESYLATE 5 MG PO TABS: 5.0000 mg | ORAL_TABLET | Freq: Every day | ORAL | 1 refills | Status: DC

## 2018-01-27 MED ORDER — IBUPROFEN 800 MG PO TABS: 800.0000 mg | ORAL_TABLET | Freq: Three times a day (TID) | ORAL | 3 refills | Status: DC | PRN

## 2018-01-27 NOTE — Progress Notes (Signed)
Follow Up  Subjective:    Patient ID: Noah Bates, male    DOB: 1975-12-05, 42 y.o.   MRN: [id number]   Chief Complaint  Patient presents with  . Hospitalization Follow-up    headaches  . Follow-up    chronic condition     HPI  Noah Bates is a 42 year old male with a past medical history of Hypertension and Diabetes. He is here today for follow up.    Current Status: Since her last office visit, she is doing well with no complaints. She denies visual changes, chest pain, cough, shortness of breath, heart palpitations, and falls. She has occasionally headaches and dizziness with position changes. Denies severe headaches, confusion, seizures, double vision, and blurred vision, nausea and vomiting. She denies fatigue, blurred vision, excessive hunger, excessive thirst, weight gain, weight loss, and poor wound healing.   She denies fevers, chills, recent infections, weight loss, and night sweats. No reports of GI problems such as diarrhea, and constipation. She has no reports of blood in stools, dysuria and hematuria. No depression or anxiety, and denies suicidal ideations, homicidal ideations, or auditory hallucinations. She denies pain today.   Past Medical History:  Diagnosis Date  . Diabetes mellitus without complication (HCC)   . Hypertension     Family History  Problem Relation Age of Onset  . Diabetes Mellitus II Mother   . Diabetes Mellitus II Father     Social History   Socioeconomic History  . Marital status: Married    Spouse name: Not on file  . Number of children: Not on file  . Years of education: Not on file  . Highest education level: Not on file  Occupational History  . Not on file  Social Needs  . Financial resource strain: Not on file  . Food insecurity:    Worry: Not on file    Inability: Not on file  . Transportation needs:    Medical: Not on file    Non-medical: Not on file  Tobacco Use  . Smoking status: Never Smoker  . Smokeless  tobacco: Never Used  Substance and Sexual Activity  . Alcohol use: Not Currently  . Drug use: Never  . Sexual activity: Not on file  Lifestyle  . Physical activity:    Days per week: Not on file    Minutes per session: Not on file  . Stress: Not on file  Relationships  . Social connections:    Talks on phone: Not on file    Gets together: Not on file    Attends religious service: Not on file    Active member of club or organization: Not on file    Attends meetings of clubs or organizations: Not on file    Relationship status: Not on file  . Intimate partner violence:    Fear of current or ex partner: Not on file    Emotionally abused: Not on file    Physically abused: Not on file    Forced sexual activity: Not on file  Other Topics Concern  . Not on file  Social History Narrative  . Not on file     Past Surgical History:  Procedure Laterality Date  . BACK SURGERY      Immunization History  Administered Date(s) Administered  . Influenza,inj,Quad PF,6+ Mos 01/27/2018  . Tdap 09/23/2017    Current Meds  Medication Sig  . aspirin EC 81 MG tablet Take 81 mg by mouth daily.  Marland Kitchen lisinopril (PRINIVIL,ZESTRIL) 20  MG tablet Take 1 tablet (20 mg total) by mouth daily.  . metFORMIN (GLUCOPHAGE) 500 MG tablet Take 1 tablet (500 mg total) by mouth 2 (two) times daily with a meal.  . naproxen sodium (ALEVE) 220 MG tablet Take 440 mg by mouth daily as needed (for headache).  . [DISCONTINUED] lisinopril (PRINIVIL,ZESTRIL) 20 MG tablet Take 1 tablet (20 mg total) by mouth daily.   No Known Allergies  BP (!) 166/84   Pulse 78   Temp 97.8 F (36.6 C) (Oral)   Ht 6\' 7"  (2.007 m)   Wt 240 lb 9.6 oz (109.1 kg)   SpO2 99%   BMI 27.10 kg/m    Review of Systems  Constitutional: Negative.   HENT: Negative.   Eyes: Negative.   Respiratory: Negative.   Cardiovascular: Negative.   Gastrointestinal: Negative.   Endocrine: Negative.   Genitourinary: Negative.   Musculoskeletal:  Negative.   Skin: Negative.   Allergic/Immunologic: Negative.   Neurological: Positive for dizziness and headaches.  Hematological: Negative.   Psychiatric/Behavioral: Negative.    Objective:   Physical Exam  Constitutional: He appears well-developed and well-nourished.  HENT:  Head: Normocephalic and atraumatic.  Eyes: Pupils are equal, round, and reactive to light. Conjunctivae and EOM are normal.  Neck: Normal range of motion. Neck supple.  Cardiovascular: Normal rate, regular rhythm, normal heart sounds and intact distal pulses.  Pulmonary/Chest: Effort normal and breath sounds normal.  Abdominal: Soft. Bowel sounds are normal.  Musculoskeletal: Normal range of motion.  Neurological: He is alert.  Skin: Skin is warm and dry.  Psychiatric: He has a normal mood and affect. His behavior is normal. Judgment and thought content normal.  Nursing note and vitals reviewed.  Assessment & Plan:   1. Type 2 diabetes mellitus with hyperglycemia, without long-term current use of insulin (HCC) Hgb A1c is stable at 7.2 today. He will continue to decrease foods/beverages high in sugars and carbs and follow Heart Healthy or DASH diet. Increase physical activity to at least 30 minutes cardio exercise daily.  - POCT urinalysis dipstick - POCT glycosylated hemoglobin (Hb A1C)  2. Essential hypertension Blood pressure is elevated today at 166/84 today. Clonidine 0.2 mg was given to patient to aid in decreasing blood pressure today. He will continue Lisinopril and Amlodipine as prescribed. She will continue to decrease high sodium intake, excessive alcohol intake, increase potassium intake, smoking cessation, and increase physical activity of at least 30 minutes of cardio activity daily. He will continue to follow Heart Healthy or DASH diet. - cloNIDine (CATAPRES) tablet 0.2 mg - lisinopril (PRINIVIL,ZESTRIL) 20 MG tablet; Take 1 tablet (20 mg total) by mouth daily.  Dispense: 30 tablet; Refill: 2 -  amLODipine (NORVASC) 5 MG tablet; Take 1 tablet (5 mg total) by mouth daily.  Dispense: 30 tablet; Refill: 1  3. Need for immunization against influenza - Flu Vaccine QUAD 36+ mos IM  4. Intractable migraine without status migrainosus, unspecified migraine type We will initiate Motrin as prescribed.  - ibuprofen (ADVIL,MOTRIN) 800 MG tablet; Take 1 tablet (800 mg total) by mouth every 8 (eight) hours as needed.  Dispense: 30 tablet; Refill: 3  5. Follow up He will follow up in 1 month.   Meds ordered this encounter  Medications  . cloNIDine (CATAPRES) tablet 0.2 mg  . lisinopril (PRINIVIL,ZESTRIL) 20 MG tablet    Sig: Take 1 tablet (20 mg total) by mouth daily.    Dispense:  30 tablet    Refill:  2  .  ibuprofen (ADVIL,MOTRIN) 800 MG tablet    Sig: Take 1 tablet (800 mg total) by mouth every 8 (eight) hours as needed.    Dispense:  30 tablet    Refill:  3  . amLODipine (NORVASC) 5 MG tablet    Sig: Take 1 tablet (5 mg total) by mouth daily.    Dispense:  30 tablet    Refill:  1   Raliegh IpNatalie Landry Kamath,  MSN, FNP-C [facility] [address]  [place], [place] [id number] [phone]

## 2018-01-27 NOTE — Patient Instructions (Signed)
Amlodipine tablets °What is this medicine? °AMLODIPINE (am LOE di peen) is a calcium-channel blocker. It affects the amount of calcium found in your heart and muscle cells. This relaxes your blood vessels, which can reduce the amount of work the heart has to do. This medicine is used to lower high blood pressure. It is also used to prevent chest pain. °This medicine may be used for other purposes; ask your health care provider or pharmacist if you have questions. °COMMON BRAND NAME(S): Norvasc °What should I tell my health care provider before I take this medicine? °They need to know if you have any of these conditions: °-heart problems like heart failure or aortic stenosis °-liver disease °-an unusual or allergic reaction to amlodipine, other medicines, foods, dyes, or preservatives °-pregnant or trying to get pregnant °-breast-feeding °How should I use this medicine? °Take this medicine by mouth with a glass of water. Follow the directions on the prescription label. Take your medicine at regular intervals. Do not take more medicine than directed. °Talk to your pediatrician regarding the use of this medicine in children. Special care may be needed. This medicine has been used in children as young as 6. °Persons over 65 years old may have a stronger reaction to this medicine and need smaller doses. °Overdosage: If you think you have taken too much of this medicine contact a poison control center or emergency room at once. °NOTE: This medicine is only for you. Do not share this medicine with others. °What if I miss a dose? °If you miss a dose, take it as soon as you can. If it is almost time for your next dose, take only that dose. Do not take double or extra doses. °What may interact with this medicine? °-herbal or dietary supplements °-local or general anesthetics °-medicines for high blood pressure °-medicines for prostate problems °-rifampin °This list may not describe all possible interactions. Give your health  care provider a list of all the medicines, herbs, non-prescription drugs, or dietary supplements you use. Also tell them if you smoke, drink alcohol, or use illegal drugs. Some items may interact with your medicine. °What should I watch for while using this medicine? °Visit your doctor or health care professional for regular check ups. Check your blood pressure and pulse rate regularly. Ask your health care professional what your blood pressure and pulse rate should be, and when you should contact him or her. °This medicine may make you feel confused, dizzy or lightheaded. Do not drive, use machinery, or do anything that needs mental alertness until you know how this medicine affects you. To reduce the risk of dizzy or fainting spells, do not sit or stand up quickly, especially if you are an older patient. Avoid alcoholic drinks; they can make you more dizzy. °Do not suddenly stop taking amlodipine. Ask your doctor or health care professional how you can gradually reduce the dose. °What side effects may I notice from receiving this medicine? °Side effects that you should report to your doctor or health care professional as soon as possible: °-allergic reactions like skin rash, itching or hives, swelling of the face, lips, or tongue °-breathing problems °-changes in vision or hearing °-chest pain °-fast, irregular heartbeat °-swelling of legs or ankles °Side effects that usually do not require medical attention (report to your doctor or health care professional if they continue or are bothersome): °-dry mouth °-facial flushing °-nausea, vomiting °-stomach gas, pain °-tired, weak °-trouble sleeping °This list may not describe all possible side   effects. Call your doctor for medical advice about side effects. You may report side effects to FDA at 1-800-FDA-1088. °Where should I keep my medicine? °Keep out of the reach of children. °Store at room temperature between 59 and 86 degrees F (15 and 30 degrees C). Protect from  light. Keep container tightly closed. Throw away any unused medicine after the expiration date. °NOTE: This sheet is a summary. It may not cover all possible information. If you have questions about this medicine, talk to your doctor, pharmacist, or health care provider. °© 2018 Elsevier/Gold Standard (2012-01-21 11:40:58) ° °

## 2018-02-07 ENCOUNTER — Ambulatory Visit (INDEPENDENT_AMBULATORY_CARE_PROVIDER_SITE_OTHER): Payer: Self-pay | Admitting: Family Medicine

## 2018-02-07 ENCOUNTER — Encounter: Payer: Self-pay | Admitting: Family Medicine

## 2018-02-07 VITALS — BP 156/80 | HR 82 | Temp 97.8°F | Ht 79.0 in | Wt 241.0 lb

## 2018-02-07 DIAGNOSIS — Z8661 Personal history of infections of the central nervous system: Secondary | ICD-10-CM

## 2018-02-07 DIAGNOSIS — G43909 Migraine, unspecified, not intractable, without status migrainosus: Secondary | ICD-10-CM

## 2018-02-07 DIAGNOSIS — I10 Essential (primary) hypertension: Secondary | ICD-10-CM

## 2018-02-07 DIAGNOSIS — G43919 Migraine, unspecified, intractable, without status migrainosus: Secondary | ICD-10-CM

## 2018-02-07 DIAGNOSIS — E1165 Type 2 diabetes mellitus with hyperglycemia: Secondary | ICD-10-CM

## 2018-02-07 LAB — POCT URINALYSIS DIP (MANUAL ENTRY)
Bilirubin, UA: NEGATIVE
Glucose, UA: 500 mg/dL — AB
Leukocytes, UA: NEGATIVE
Nitrite, UA: NEGATIVE
Protein Ur, POC: 30 mg/dL — AB
Spec Grav, UA: 1.025 (ref 1.010–1.025)
Urobilinogen, UA: 1 E.U./dL
pH, UA: 6 (ref 5.0–8.0)

## 2018-02-07 LAB — GLUCOSE, POCT (MANUAL RESULT ENTRY): POC Glucose: 201 mg/dl — AB (ref 70–99)

## 2018-02-07 MED ORDER — AMLODIPINE BESYLATE 10 MG PO TABS: 10.0000 mg | ORAL_TABLET | Freq: Every day | ORAL | 3 refills | Status: DC

## 2018-02-07 MED ORDER — IBUPROFEN 800 MG PO TABS: 800.0000 mg | ORAL_TABLET | Freq: Three times a day (TID) | ORAL | 3 refills | Status: DC | PRN

## 2018-02-07 MED ORDER — LEVOCETIRIZINE DIHYDROCHLORIDE 5 MG PO TABS: 5.0000 mg | ORAL_TABLET | Freq: Every evening | ORAL | 5 refills | Status: DC

## 2018-02-07 MED ORDER — FLUTICASONE PROPIONATE 50 MCG/ACT NA SUSP: 2.0000 | Freq: Every day | NASAL | 6 refills | Status: DC

## 2018-02-07 MED ORDER — SUMATRIPTAN SUCCINATE 50 MG PO TABS: 50.0000 mg | ORAL_TABLET | ORAL | 0 refills | Status: DC | PRN

## 2018-02-07 NOTE — Progress Notes (Signed)
po

## 2018-02-07 NOTE — Patient Instructions (Addendum)
I am going to hold off on sending the imitrex for the migraines until the BP is under better control. I am increasing the amlodipine to 10mg  daily. I sent this to the pharmacy.  There is glucose (sugar) in your urine today. Please watch your carbohydrate intake. If still present at the next visit, we  will need to increase your metformin.  I sent a referral to neurology.  Please follow up with Noah SchimkeN. Stroud, NP in the next 4 weeks.      Migraine Headache A migraine headache is a very strong throbbing pain on one side or both sides of your head. Migraines can also cause other symptoms. Talk with your doctor about what things may bring on (trigger) your migraine headaches. Follow these instructions at home: Medicines  Take over-the-counter and prescription medicines only as told by your doctor.  Do not drive or use heavy machinery while taking prescription pain medicine.  To prevent or treat constipation while you are taking prescription pain medicine, your doctor may recommend that you: ? Drink enough fluid to keep your pee (urine) clear or pale yellow. ? Take over-the-counter or prescription medicines. ? Eat foods that are high in fiber. These include fresh fruits and vegetables, whole grains, and beans. ? Limit foods that are high in fat and processed sugars. These include fried and sweet foods. Lifestyle  Avoid alcohol.  Do not use any products that contain nicotine or tobacco, such as cigarettes and e-cigarettes. If you need help quitting, ask your doctor.  Get at least 8 hours of sleep every night.  Limit your stress. General instructions   Keep a journal to find out what may bring on your migraines. For example, write down: ? What you eat and drink. ? How much sleep you get. ? Any change in what you eat or drink. ? Any change in your medicines.  If you have a migraine: ? Avoid things that make your symptoms worse, such as bright lights. ? It may help to lie down in a dark,  quiet room. ? Do not drive or use heavy machinery. ? Ask your doctor what activities are safe for you.  Keep all follow-up visits as told by your doctor. This is important. Contact a doctor if:  You get a migraine that is different or worse than your usual migraines. Get help right away if:  Your migraine gets very bad.  You have a fever.  You have a stiff neck.  You have trouble seeing.  Your muscles feel weak or like you cannot control them.  You start to lose your balance a lot.  You start to have trouble walking.  You pass out (faint). This information is not intended to replace advice given to you by your health care provider. Make sure you discuss any questions you have with your health care provider. Document Released: 12/02/2007 Document Revised: 09/12/2015 Document Reviewed: 08/11/2015 Elsevier Interactive Patient Education  2018 [employer]

## 2018-02-07 NOTE — Progress Notes (Signed)
Established Patient Office Visit  Subjective:  Patient ID: Noah Bates, male    DOB: May 24, 1975  Age: 42 y.o. MRN: [id number]  CC:  Chief Complaint  Patient presents with  . Follow-up    HTN,headaches    HPI [name] presents for follow-up on hypertension headaches.  Patient states that he has had fewer headaches since his last visit.  He gets some relief from ibuprofen. Patient with A1c of 7.2 at last visit.  Today his fasting glucose is 201.  He states that he has not been following his normal vegan/plant-based diet due to traveling. 24 hour diet recall: Pasta, pizza, shrimp fried rice, water Patient with a history of viral meningitis.  He states that he had a spinal tap in June and has had increased headaches since then.  He has not followed up with neurology.  Patient states that he has phonophobia photophobia nausea with headaches.  Takes typically start at the base of the right side of the head and extend around to the front part of the eye.  He denies aura with migraines. Past Medical History:  Diagnosis Date  . Diabetes mellitus without complication (HCC)   . Hypertension     Past Surgical History:  Procedure Laterality Date  . BACK SURGERY      Family History  Problem Relation Age of Onset  . Diabetes Mellitus II Mother   . Diabetes Mellitus II Father     Social History   Socioeconomic History  . Marital status: Married    Spouse name: Not on file  . Number of children: Not on file  . Years of education: Not on file  . Highest education level: Not on file  Occupational History  . Not on file  Social Needs  . Financial resource strain: Not on file  . Food insecurity:    Worry: Not on file    Inability: Not on file  . Transportation needs:    Medical: Not on file    Non-medical: Not on file  Tobacco Use  . Smoking status: Never Smoker  . Smokeless tobacco: Never Used  Substance and Sexual Activity  . Alcohol use: Not Currently  .  Drug use: Never  . Sexual activity: Not on file  Lifestyle  . Physical activity:    Days per week: Not on file    Minutes per session: Not on file  . Stress: Not on file  Relationships  . Social connections:    Talks on phone: Not on file    Gets together: Not on file    Attends religious service: Not on file    Active member of club or organization: Not on file    Attends meetings of clubs or organizations: Not on file    Relationship status: Not on file  . Intimate partner violence:    Fear of current or ex partner: Not on file    Emotionally abused: Not on file    Physically abused: Not on file    Forced sexual activity: Not on file  Other Topics Concern  . Not on file  Social History Narrative  . Not on file    Outpatient Medications Prior to Visit  Medication Sig Dispense Refill  . aspirin EC 81 MG tablet Take 81 mg by mouth daily.    Marland Kitchen lisinopril (PRINIVIL,ZESTRIL) 20 MG tablet Take 1 tablet (20 mg total) by mouth daily. 30 tablet 2  . metFORMIN (GLUCOPHAGE) 500 MG tablet Take 1 tablet (500  mg total) by mouth 2 (two) times daily with a meal. 60 tablet 2  . amLODipine (NORVASC) 5 MG tablet Take 1 tablet (5 mg total) by mouth daily. 30 tablet 1  . ibuprofen (ADVIL,MOTRIN) 800 MG tablet Take 1 tablet (800 mg total) by mouth every 8 (eight) hours as needed. 30 tablet 3  . naproxen sodium (ALEVE) 220 MG tablet Take 440 mg by mouth daily as needed (for headache).     No facility-administered medications prior to visit.     No Known Allergies  ROS Review of Systems  Constitutional: Negative.   HENT: Negative.   Eyes: Positive for photophobia.  Respiratory: Negative.   Cardiovascular: Negative.   Gastrointestinal: Positive for nausea (With headache.).  Endocrine: Negative.   Genitourinary: Negative.   Musculoskeletal: Negative.   Skin: Negative.   Allergic/Immunologic: Negative.   Neurological: Positive for headaches. Negative for dizziness, seizures, syncope,  facial asymmetry, speech difficulty, weakness and numbness.  Hematological: Negative.   Psychiatric/Behavioral: Negative.       Objective:    Physical Exam  Constitutional: He is oriented to person, place, and time. He appears well-developed and well-nourished. No distress.  HENT:  Head: Normocephalic and atraumatic.  Nose: Mucosal edema (with pale turbinates) present.  Mouth/Throat: Uvula is midline, oropharynx is clear and moist and mucous membranes are normal.  Eyes: Pupils are equal, round, and reactive to light. Conjunctivae and EOM are normal.  Neck: Normal range of motion.  Cardiovascular: Normal rate, regular rhythm and normal heart sounds.  Pulmonary/Chest: Effort normal and breath sounds normal. No respiratory distress.  Musculoskeletal: Normal range of motion.  Neurological: He is alert and oriented to person, place, and time.  Skin: Skin is warm and dry.  Psychiatric: He has a normal mood and affect. His behavior is normal. Judgment and thought content normal.  Nursing note and vitals reviewed.   BP (!) 156/80   Pulse 82   Temp 97.8 F (36.6 C) (Oral)   Ht 6\' 7"  (2.007 m)   Wt 241 lb (109.3 kg)   SpO2 100%   BMI 27.15 kg/m  Wt Readings from Last 3 Encounters:  02/07/18 241 lb (109.3 kg)  01/27/18 240 lb 9.6 oz (109.1 kg)  09/23/17 236 lb (107 kg)     There are no preventive care reminders to display for this patient.   No results found for: TSH Lab Results  Component Value Date   WBC 6.4 01/08/2018   HGB 13.2 01/08/2018   HCT 41.3 01/08/2018   MCV 81.3 01/08/2018   PLT 179 01/08/2018   Lab Results  Component Value Date   NA 140 01/08/2018   K 4.0 01/08/2018   CO2 27 01/08/2018   GLUCOSE 169 (H) 01/08/2018   BUN 9 01/08/2018   CREATININE 0.77 01/08/2018   BILITOT 1.6 (H) 01/08/2018   ALKPHOS 48 01/08/2018   AST 16 01/08/2018   ALT 14 01/08/2018   PROT 6.4 (L) 01/08/2018   ALBUMIN 4.2 01/08/2018   CALCIUM 9.2 01/08/2018   ANIONGAP 8  01/08/2018   No results found for: CHOL No results found for: HDL No results found for: LDLCALC No results found for: TRIG No results found for: CHOLHDL Lab Results  Component Value Date   HGBA1C 7.2 (A) 01/27/2018      Assessment & Plan:   Problem List Items Addressed This Visit      Cardiovascular and Mediastinum   Essential hypertension - Primary   Relevant Medications   amLODipine (NORVASC) 10  MG tablet   Other Relevant Orders   POCT urinalysis dipstick (Completed)     Endocrine   Type 2 diabetes mellitus with hyperglycemia (HCC)   Relevant Orders   POCT glucose (manual entry) (Completed)    Other Visit Diagnoses    Migraine without status migrainosus, not intractable, unspecified migraine type       Relevant Medications   fluticasone (FLONASE) 50 MCG/ACT nasal spray   levocetirizine (XYZAL) 5 MG tablet   amLODipine (NORVASC) 10 MG tablet   ibuprofen (ADVIL,MOTRIN) 800 MG tablet   Other Relevant Orders   Ambulatory referral to Neurology   History of meningitis       Relevant Orders   Ambulatory referral to Neurology   Intractable migraine without status migrainosus, unspecified migraine type       Relevant Medications   amLODipine (NORVASC) 10 MG tablet   ibuprofen (ADVIL,MOTRIN) 800 MG tablet      Meds ordered this encounter  Medications  . DISCONTD: SUMAtriptan (IMITREX) 50 MG tablet    Sig: Take 1 tablet (50 mg total) by mouth every 2 (two) hours as needed for migraine. Repeat q2H if HA persists or recurs. Max 200mg Izola Price/24hours    Dispense:  10 tablet    Refill:  0  . fluticasone (FLONASE) 50 MCG/ACT nasal spray    Sig: Place 2 sprays into both nostrils daily.    Dispense:  16 g    Refill:  6  . levocetirizine (XYZAL) 5 MG tablet    Sig: Take 1 tablet (5 mg total) by mouth every evening.    Dispense:  30 tablet    Refill:  5  . amLODipine (NORVASC) 10 MG tablet    Sig: Take 1 tablet (10 mg total) by mouth daily.    Dispense:  90 tablet    Refill:  3   . ibuprofen (ADVIL,MOTRIN) 800 MG tablet    Sig: Take 1 tablet (800 mg total) by mouth every 8 (eight) hours as needed.    Dispense:  30 tablet    Refill:  3    Follow-up: Return if symptoms worsen or fail to improve.   Referral placed for neurology.  No Imitrex started today due to uncontrolled blood pressure.  Increased amlodipine to 10 mg daily.  Patient to follow-up in the next few weeks with Raliegh IpNatalie Stroud, NP. Mike GipAndre Augusta Hilbert, FNP

## 2018-03-02 ENCOUNTER — Other Ambulatory Visit: Payer: Self-pay | Admitting: Family Medicine

## 2018-03-02 DIAGNOSIS — E1165 Type 2 diabetes mellitus with hyperglycemia: Secondary | ICD-10-CM

## 2018-03-12 ENCOUNTER — Other Ambulatory Visit: Payer: Self-pay | Admitting: Family Medicine

## 2018-03-12 DIAGNOSIS — E1165 Type 2 diabetes mellitus with hyperglycemia: Secondary | ICD-10-CM

## 2018-03-13 ENCOUNTER — Ambulatory Visit: Payer: Self-pay | Admitting: Family Medicine

## 2018-03-29 ENCOUNTER — Ambulatory Visit: Payer: Self-pay | Admitting: Family Medicine

## 2018-04-03 ENCOUNTER — Encounter: Payer: Self-pay | Admitting: Family Medicine

## 2018-04-03 ENCOUNTER — Ambulatory Visit (INDEPENDENT_AMBULATORY_CARE_PROVIDER_SITE_OTHER): Payer: Self-pay | Admitting: Family Medicine

## 2018-04-03 VITALS — BP 160/82 | HR 84 | Temp 98.0°F | Ht 79.0 in | Wt 238.0 lb

## 2018-04-03 DIAGNOSIS — R8290 Unspecified abnormal findings in urine: Secondary | ICD-10-CM

## 2018-04-03 DIAGNOSIS — G43909 Migraine, unspecified, not intractable, without status migrainosus: Secondary | ICD-10-CM

## 2018-04-03 DIAGNOSIS — E1165 Type 2 diabetes mellitus with hyperglycemia: Secondary | ICD-10-CM

## 2018-04-03 DIAGNOSIS — Z09 Encounter for follow-up examination after completed treatment for conditions other than malignant neoplasm: Secondary | ICD-10-CM

## 2018-04-03 DIAGNOSIS — I10 Essential (primary) hypertension: Secondary | ICD-10-CM

## 2018-04-03 LAB — POCT GLYCOSYLATED HEMOGLOBIN (HGB A1C): Hemoglobin A1C: 8.3 % — AB (ref 4.0–5.6)

## 2018-04-03 LAB — POCT URINALYSIS DIP (MANUAL ENTRY)
Bilirubin, UA: NEGATIVE
Glucose, UA: 250 mg/dL — AB
Ketones, POC UA: NEGATIVE mg/dL
Leukocytes, UA: NEGATIVE
Nitrite, UA: NEGATIVE
Protein Ur, POC: 30 mg/dL — AB
Spec Grav, UA: >=1.03 — AB (ref 1.010–1.025)
Urobilinogen, UA: 1 E.U./dL
pH, UA: 5 (ref 5.0–8.0)

## 2018-04-03 MED ORDER — METFORMIN HCL 500 MG PO TABS: 500.0000 mg | ORAL_TABLET | Freq: Two times a day (BID) | ORAL | 6 refills | Status: DC

## 2018-04-03 NOTE — Progress Notes (Signed)
Established Patient Office Visit  Subjective:  Patient ID: Noah Bates, male    DOB: Jan 30, 1976  Age: 42 y.o. MRN: [id number]  CC: No chief complaint on file.   HPI Noah Bates is a 43 year old male who presents for follow up of chronic diseases.   Past Medical History:  Diagnosis Date  . Diabetes mellitus without complication (HCC)   . Hypertension    Current Status: Since his last office visit, he is doing well with no complaints. He is accompanied by his wife today.  He denies visual changes, chest pain, cough, shortness of breath, heart palpitations, and falls. He has occasionally headaches and dizziness with position changes. Denies severe headaches, confusion, seizures, double vision, and blurred vision, nausea and vomiting. He has not been checking blood glucose levels. He denies fatigue, frequent urination, blurred vision, excessive hunger, excessive thirst, weight gain, weight loss, and poor wound healing.   He denies fevers, chills, recent infections, weight loss, and night sweats. No reports of GI problems such as diarrhea, and constipation. He has no reports of blood in stools, dysuria and hematuria. No depression or anxiety reported. He denies pain today.   Past Surgical History:  Procedure Laterality Date  . BACK SURGERY      Family History  Problem Relation Age of Onset  . Diabetes Mellitus II Mother   . Diabetes Mellitus II Father     Social History   Socioeconomic History  . Marital status: Married    Spouse name: Not on file  . Number of children: Not on file  . Years of education: Not on file  . Highest education level: Not on file  Occupational History  . Not on file  Social Needs  . Financial resource strain: Not on file  . Food insecurity:    Worry: Not on file    Inability: Not on file  . Transportation needs:    Medical: Not on file    Non-medical: Not on file  Tobacco Use  . Smoking status: Never Smoker  . Smokeless  tobacco: Never Used  Substance and Sexual Activity  . Alcohol use: Not Currently  . Drug use: Never  . Sexual activity: Not on file  Lifestyle  . Physical activity:    Days per week: Not on file    Minutes per session: Not on file  . Stress: Not on file  Relationships  . Social connections:    Talks on phone: Not on file    Gets together: Not on file    Attends religious service: Not on file    Active member of club or organization: Not on file    Attends meetings of clubs or organizations: Not on file    Relationship status: Not on file  . Intimate partner violence:    Fear of current or ex partner: Not on file    Emotionally abused: Not on file    Physically abused: Not on file    Forced sexual activity: Not on file  Other Topics Concern  . Not on file  Social History Narrative  . Not on file    Outpatient Medications Prior to Visit  Medication Sig Dispense Refill  . amLODipine (NORVASC) 10 MG tablet Take 1 tablet (10 mg total) by mouth daily. 90 tablet 3  . aspirin EC 81 MG tablet Take 81 mg by mouth daily.    . fluticasone (FLONASE) 50 MCG/ACT nasal spray Place 2 sprays into both nostrils daily. 16 g  6  . ibuprofen (ADVIL,MOTRIN) 800 MG tablet Take 1 tablet (800 mg total) by mouth every 8 (eight) hours as needed. 30 tablet 3  . levocetirizine (XYZAL) 5 MG tablet Take 1 tablet (5 mg total) by mouth every evening. 30 tablet 5  . lisinopril (PRINIVIL,ZESTRIL) 20 MG tablet Take 1 tablet (20 mg total) by mouth daily. 30 tablet 2  . metFORMIN (GLUCOPHAGE) 500 MG tablet Take 1 tablet (500 mg total) by mouth 2 (two) times daily with a meal. 60 tablet 2   No facility-administered medications prior to visit.     No Known Allergies  ROS Review of Systems  Constitutional: Negative.   HENT: Negative.   Eyes: Negative.   Respiratory: Negative.   Cardiovascular: Negative.   Gastrointestinal: Negative.   Endocrine: Negative.   Genitourinary: Negative.   Musculoskeletal:  Negative.   Skin: Negative.   Allergic/Immunologic: Negative.   Neurological: Positive for dizziness and headaches.  Hematological: Negative.   Psychiatric/Behavioral: Negative.     Objective:    Physical Exam  Constitutional: He is oriented to person, place, and time. He appears well-developed and well-nourished.  HENT:  Head: Normocephalic and atraumatic.  Eyes: Conjunctivae are normal.  Neck: Normal range of motion. Neck supple.  Cardiovascular: Normal rate, regular rhythm, normal heart sounds and intact distal pulses.  Pulmonary/Chest: Effort normal and breath sounds normal.  Abdominal: Soft. Bowel sounds are normal.  Musculoskeletal: Normal range of motion.  Neurological: He is alert and oriented to person, place, and time. He has normal reflexes.  Skin: Skin is warm and dry.  Psychiatric: He has a normal mood and affect. His behavior is normal. Judgment and thought content normal.  Nursing note and vitals reviewed.   BP (!) 160/80   Pulse 84   Temp 98 F (36.7 C) (Oral)   Ht 6\' 7"  (2.007 m)   Wt 238 lb (108 kg)   BMI 26.81 kg/m  Wt Readings from Last 3 Encounters:  04/03/18 238 lb (108 kg)  02/07/18 241 lb (109.3 kg)  01/27/18 240 lb 9.6 oz (109.1 kg)     There are no preventive care reminders to display for this patient.  There are no preventive care reminders to display for this patient.  No results found for: TSH Lab Results  Component Value Date   WBC 6.4 01/08/2018   HGB 13.2 01/08/2018   HCT 41.3 01/08/2018   MCV 81.3 01/08/2018   PLT 179 01/08/2018   Lab Results  Component Value Date   NA 140 01/08/2018   K 4.0 01/08/2018   CO2 27 01/08/2018   GLUCOSE 169 (H) 01/08/2018   BUN 9 01/08/2018   CREATININE 0.77 01/08/2018   BILITOT 1.6 (H) 01/08/2018   ALKPHOS 48 01/08/2018   AST 16 01/08/2018   ALT 14 01/08/2018   PROT 6.4 (L) 01/08/2018   ALBUMIN 4.2 01/08/2018   CALCIUM 9.2 01/08/2018   ANIONGAP 8 01/08/2018   No results found for:  CHOL No results found for: HDL No results found for: LDLCALC No results found for: TRIG No results found for: CHOLHDL Lab Results  Component Value Date   HGBA1C 8.3 (A) 04/03/2018   Assessment & Plan:   1. Type 2 diabetes mellitus with hyperglycemia, without long-term current use of insulin (HCC) Hgb A1c is mildly elevated at 8.2, from 7.2 on 01/27/2018. He will continue Metformin as prescribed. He will continue to decrease foods/beverages high in sugars and carbs and follow Heart Healthy or DASH diet. Increase physical  activity to at least 30 minutes cardio exercise daily.   - POCT glycosylated hemoglobin (Hb A1C) - POCT urinalysis dipstick  2. Essential hypertension Blood pressures are elevated today. We will schedule him for blood pressure re-check in 2 weeks. He will continue to decrease high sodium intake, excessive alcohol intake, increase potassium intake, smoking cessation, and increase physical activity of at least 30 minutes of cardio activity daily. He will continue to follow Heart Healthy or DASH diet.  3. Migraine without status migrainosus, not intractable, unspecified migraine type He has not had migraine lately. Continue Motrin as needed. We will continue to monitor.   4. Follow up He will follow up in 3 months.  He will follow up in 2 weeks for blood pressure only.   No orders of the defined types were placed in this encounter.  Raliegh IpNatalie Jessee Mezera,  MSN, FNP-C [facility] [address]  [place], [place] [id number] [phone]  Problem List Items Addressed This Visit      Cardiovascular and Mediastinum   Essential hypertension     Endocrine   Type 2 diabetes mellitus with hyperglycemia (HCC) - Primary   Relevant Orders   POCT glycosylated hemoglobin (Hb A1C) (Completed)   POCT urinalysis dipstick (Completed)    Other Visit Diagnoses    Migraine without status migrainosus, not intractable, unspecified migraine type        Follow up          No orders of the defined types were placed in this encounter.   Follow-up: Return in about 3 months (around 07/03/2018).    Kallie LocksNatalie M Sherard Sutch, FNP

## 2018-04-05 LAB — URINE CULTURE: Organism ID, Bacteria: NO GROWTH

## 2018-06-16 ENCOUNTER — Other Ambulatory Visit: Payer: Self-pay | Admitting: Family Medicine

## 2018-06-16 DIAGNOSIS — I10 Essential (primary) hypertension: Secondary | ICD-10-CM

## 2018-07-10 ENCOUNTER — Ambulatory Visit: Payer: Self-pay | Admitting: Family Medicine

## 2018-08-03 ENCOUNTER — Telehealth: Payer: Self-pay

## 2018-08-03 NOTE — Telephone Encounter (Signed)
Patient negative for Cov-19 screening and will be coming in office

## 2018-08-04 ENCOUNTER — Ambulatory Visit (INDEPENDENT_AMBULATORY_CARE_PROVIDER_SITE_OTHER): Payer: Self-pay | Admitting: Family Medicine

## 2018-08-04 ENCOUNTER — Other Ambulatory Visit: Payer: Self-pay

## 2018-08-04 ENCOUNTER — Encounter: Payer: Self-pay | Admitting: Family Medicine

## 2018-08-04 VITALS — BP 158/84 | HR 90 | Temp 98.2°F | Ht 79.0 in | Wt 250.2 lb

## 2018-08-04 DIAGNOSIS — Z09 Encounter for follow-up examination after completed treatment for conditions other than malignant neoplasm: Secondary | ICD-10-CM

## 2018-08-04 DIAGNOSIS — E1165 Type 2 diabetes mellitus with hyperglycemia: Secondary | ICD-10-CM

## 2018-08-04 DIAGNOSIS — I10 Essential (primary) hypertension: Secondary | ICD-10-CM

## 2018-08-04 DIAGNOSIS — R7309 Other abnormal glucose: Secondary | ICD-10-CM

## 2018-08-04 DIAGNOSIS — R8290 Unspecified abnormal findings in urine: Secondary | ICD-10-CM

## 2018-08-04 LAB — GLUCOSE, POCT (MANUAL RESULT ENTRY): POC Glucose: 259 mg/dl — AB (ref 70–99)

## 2018-08-04 LAB — POCT GLYCOSYLATED HEMOGLOBIN (HGB A1C): Hemoglobin A1C: 9.9 % — AB (ref 4.0–5.6)

## 2018-08-04 LAB — POCT URINALYSIS DIP (MANUAL ENTRY)
Bilirubin, UA: NEGATIVE
Glucose, UA: 500 mg/dL — AB
Ketones, POC UA: NEGATIVE mg/dL
Leukocytes, UA: NEGATIVE
Nitrite, UA: NEGATIVE
Protein Ur, POC: 30 mg/dL — AB
Spec Grav, UA: 1.02 (ref 1.010–1.025)
Urobilinogen, UA: 0.2 E.U./dL
pH, UA: 5 (ref 5.0–8.0)

## 2018-08-04 MED ORDER — GLIPIZIDE 10 MG PO TABS
10.0000 mg | ORAL_TABLET | Freq: Two times a day (BID) | ORAL | 3 refills | Status: DC
Start: 2018-08-04 — End: 2019-01-04

## 2018-08-04 MED ORDER — AMLODIPINE BESYLATE 10 MG PO TABS: 10.0000 mg | ORAL_TABLET | Freq: Every day | ORAL | 3 refills | Status: DC

## 2018-08-04 MED ORDER — METFORMIN HCL 1000 MG PO TABS
1000.0000 mg | ORAL_TABLET | Freq: Two times a day (BID) | ORAL | 3 refills | Status: DC
Start: 2018-08-04 — End: 2019-09-05

## 2018-08-04 MED ORDER — HYDROCHLOROTHIAZIDE 25 MG PO TABS: 25.0000 mg | ORAL_TABLET | Freq: Every day | ORAL | 3 refills | Status: DC

## 2018-08-04 MED ORDER — CLONIDINE HCL 0.1 MG PO TABS
0.1000 mg | ORAL_TABLET | Freq: Once | ORAL | Status: AC
  Administered 2018-08-04: 0.1 mg via ORAL

## 2018-08-04 MED ORDER — LISINOPRIL 20 MG PO TABS: 20.0000 mg | ORAL_TABLET | Freq: Every day | ORAL | 2 refills | Status: DC

## 2018-08-04 NOTE — Patient Instructions (Addendum)
Hydrochlorothiazide, HCTZ capsules or tablets What is this medicine? HYDROCHLOROTHIAZIDE (hye droe klor oh THYE a zide) is a diuretic. It increases the amount of urine passed, which causes the body to lose salt and water. This medicine is used to treat high blood pressure. It is also reduces the swelling and water retention caused by various medical conditions, such as heart, liver, or kidney disease. This medicine may be used for other purposes; ask your health care provider or pharmacist if you have questions. COMMON BRAND NAME(S): Esidrix, Ezide, HydroDIURIL, Microzide, Oretic, Zide What should I tell my health care provider before I take this medicine? They need to know if you have any of these conditions: -diabetes -gout -immune system problems, like lupus -kidney disease or kidney stones -liver disease -pancreatitis -small amount of urine or difficulty passing urine -an unusual or allergic reaction to hydrochlorothiazide, sulfa drugs, other medicines, foods, dyes, or preservatives -pregnant or trying to get pregnant -breast-feeding How should I use this medicine? Take this medicine by mouth with a glass of water. Follow the directions on the prescription label. Take your medicine at regular intervals. Remember that you will need to pass urine frequently after taking this medicine. Do not take your doses at a time of day that will cause you problems. Do not stop taking your medicine unless your doctor tells you to. Talk to your pediatrician regarding the use of this medicine in children. Special care may be needed. Overdosage: If you think you have taken too much of this medicine contact a poison control center or emergency room at once. NOTE: This medicine is only for you. Do not share this medicine with others. What if I miss a dose? If you miss a dose, take it as soon as you can. If it is almost time for your next dose, take only that dose. Do not take double or extra doses. What may  interact with this medicine? -cholestyramine -colestipol -digoxin -dofetilide -lithium -medicines for blood pressure -medicines for diabetes -medicines that relax muscles for surgery -other diuretics -steroid medicines like prednisone or cortisone This list may not describe all possible interactions. Give your health care provider a list of all the medicines, herbs, non-prescription drugs, or dietary supplements you use. Also tell them if you smoke, drink alcohol, or use illegal drugs. Some items may interact with your medicine. What should I watch for while using this medicine? Visit your doctor or health care professional for regular checks on your progress. Check your blood pressure as directed. Ask your doctor or health care professional what your blood pressure should be and when you should contact him or her. You may need to be on a special diet while taking this medicine. Ask your doctor. Check with your doctor or health care professional if you get an attack of severe diarrhea, nausea and vomiting, or if you sweat a lot. The loss of too much body fluid can make it dangerous for you to take this medicine. You may get drowsy or dizzy. Do not drive, use machinery, or do anything that needs mental alertness until you know how this medicine affects you. Do not stand or sit up quickly, especially if you are an older patient. This reduces the risk of dizzy or fainting spells. Alcohol may interfere with the effect of this medicine. Avoid alcoholic drinks. This medicine may affect your blood sugar level. If you have diabetes, check with your doctor or health care professional before changing the dose of your diabetic medicine. This medicine  can make you more sensitive to the sun. Keep out of the sun. If you cannot avoid being in the sun, wear protective clothing and use sunscreen. Do not use sun lamps or tanning beds/booths. What side effects may I notice from receiving this medicine? Side effects  that you should report to your doctor or health care professional as soon as possible: -allergic reactions such as skin rash or itching, hives, swelling of the lips, mouth, tongue, or throat -changes in vision -chest pain -eye pain -fast or irregular heartbeat -feeling faint or lightheaded, falls -gout attack -muscle pain or cramps -pain or difficulty when passing urine -pain, tingling, numbness in the hands or feet -redness, blistering, peeling or loosening of the skin, including inside the mouth -unusually weak or tired Side effects that usually do not require medical attention (report to your doctor or health care professional if they continue or are bothersome): -change in sex drive or performance -dry mouth -headache -stomach upset This list may not describe all possible side effects. Call your doctor for medical advice about side effects. You may report side effects to FDA at 1-800-FDA-1088. Where should I keep my medicine? Keep out of the reach of children. Store at room temperature between 15 and 30 degrees C (59 and 86 degrees F). Do not freeze. Protect from light and moisture. Keep container closed tightly. Throw away any unused medicine after the expiration date. NOTE: This sheet is a summary. It may not cover all possible information. If you have questions about this medicine, talk to your doctor, pharmacist, or health care provider.  2019 Elsevier/Gold Standard (2009-10-17 12:57:37) Hypertension Hypertension, commonly called high blood pressure, is when the force of blood pumping through the arteries is too strong. The arteries are the blood vessels that carry blood from the heart throughout the body. Hypertension forces the heart to work harder to pump blood and may cause arteries to become narrow or stiff. Having untreated or uncontrolled hypertension can cause heart attacks, strokes, kidney disease, and other problems. A blood pressure reading consists of a higher number  over a lower number. Ideally, your blood pressure should be below 120/80. The first ("top") number is called the systolic pressure. It is a measure of the pressure in your arteries as your heart beats. The second ("bottom") number is called the diastolic pressure. It is a measure of the pressure in your arteries as the heart relaxes. What are the causes? The cause of this condition is not known. What increases the risk? Some risk factors for high blood pressure are under your control. Others are not. Factors you can change  Smoking.  Having type 2 diabetes mellitus, high cholesterol, or both.  Not getting enough exercise or physical activity.  Being overweight.  Having too much fat, sugar, calories, or salt (sodium) in your diet.  Drinking too much alcohol. Factors that are difficult or impossible to change  Having chronic kidney disease.  Having a family history of high blood pressure.  Age. Risk increases with age.  Race. You may be at higher risk if you are African-American.  Gender. Men are at higher risk than women before age 31. After age 83, women are at higher risk than men.  Having obstructive sleep apnea.  Stress. What are the signs or symptoms? Extremely high blood pressure (hypertensive crisis) may cause:  Headache.  Anxiety.  Shortness of breath.  Nosebleed.  Nausea and vomiting.  Severe chest pain.  Jerky movements you cannot control (seizures). How is this  diagnosed? This condition is diagnosed by measuring your blood pressure while you are seated, with your arm resting on a surface. The cuff of the blood pressure monitor will be placed directly against the skin of your upper arm at the level of your heart. It should be measured at least twice using the same arm. Certain conditions can cause a difference in blood pressure between your right and left arms. Certain factors can cause blood pressure readings to be lower or higher than normal (elevated)  for a short period of time:  When your blood pressure is higher when you are in a health care provider's office than when you are at home, this is called white coat hypertension. Most people with this condition do not need medicines.  When your blood pressure is higher at home than when you are in a health care provider's office, this is called masked hypertension. Most people with this condition may need medicines to control blood pressure. If you have a high blood pressure reading during one visit or you have normal blood pressure with other risk factors:  You may be asked to return on a different day to have your blood pressure checked again.  You may be asked to monitor your blood pressure at home for 1 week or longer. If you are diagnosed with hypertension, you may have other blood or imaging tests to help your health care provider understand your overall risk for other conditions. How is this treated? This condition is treated by making healthy lifestyle changes, such as eating healthy foods, exercising more, and reducing your alcohol intake. Your health care provider may prescribe medicine if lifestyle changes are not enough to get your blood pressure under control, and if:  Your systolic blood pressure is above 130.  Your diastolic blood pressure is above 80. Your personal target blood pressure may vary depending on your medical conditions, your age, and other factors. Follow these instructions at home: Eating and drinking   Eat a diet that is high in fiber and potassium, and low in sodium, added sugar, and fat. An example eating plan is called the DASH (Dietary Approaches to Stop Hypertension) diet. To eat this way: ? Eat plenty of fresh fruits and vegetables. Try to fill half of your plate at each meal with fruits and vegetables. ? Eat whole grains, such as whole wheat pasta, brown rice, or whole grain bread. Fill about one quarter of your plate with whole grains. ? Eat or drink  low-fat dairy products, such as skim milk or low-fat yogurt. ? Avoid fatty cuts of meat, processed or cured meats, and poultry with skin. Fill about one quarter of your plate with lean proteins, such as fish, chicken without skin, beans, eggs, and tofu. ? Avoid premade and processed foods. These tend to be higher in sodium, added sugar, and fat.  Reduce your daily sodium intake. Most people with hypertension should eat less than 1,500 mg of sodium a day.  Limit alcohol intake to no more than 1 drink a day for nonpregnant women and 2 drinks a day for men. One drink equals 12 oz of beer, 5 oz of wine, or 1 oz of hard liquor. Lifestyle   Work with your health care provider to maintain a healthy body weight or to lose weight. Ask what an ideal weight is for you.  Get at least 30 minutes of exercise that causes your heart to beat faster (aerobic exercise) most days of the week. Activities may include walking,  swimming, or biking.  Include exercise to strengthen your muscles (resistance exercise), such as pilates or lifting weights, as part of your weekly exercise routine. Try to do these types of exercises for 30 minutes at least 3 days a week.  Do not use any products that contain nicotine or tobacco, such as cigarettes and e-cigarettes. If you need help quitting, ask your health care provider.  Monitor your blood pressure at home as told by your health care provider.  Keep all follow-up visits as told by your health care provider. This is important. Medicines  Take over-the-counter and prescription medicines only as told by your health care provider. Follow directions carefully. Blood pressure medicines must be taken as prescribed.  Do not skip doses of blood pressure medicine. Doing this puts you at risk for problems and can make the medicine less effective.  Ask your health care provider about side effects or reactions to medicines that you should watch for. Contact a health care provider  if:  You think you are having a reaction to a medicine you are taking.  You have headaches that keep coming back (recurring).  You feel dizzy.  You have swelling in your ankles.  You have trouble with your vision. Get help right away if:  You develop a severe headache or confusion.  You have unusual weakness or numbness.  You feel faint.  You have severe pain in your chest or abdomen.  You vomit repeatedly.  You have trouble breathing. Summary  Hypertension is when the force of blood pumping through your arteries is too strong. If this condition is not controlled, it may put you at risk for serious complications.  Your personal target blood pressure may vary depending on your medical conditions, your age, and other factors. For most people, a normal blood pressure is less than 120/80.  Hypertension is treated with lifestyle changes, medicines, or a combination of both. Lifestyle changes include weight loss, eating a healthy, low-sodium diet, exercising more, and limiting alcohol. This information is not intended to replace advice given to you by your health care provider. Make sure you discuss any questions you have with your health care provider. Document Released: 02/22/2005 Document Revised: 01/21/2016 Document Reviewed: 01/21/2016 Elsevier Interactive Patient Education  2019 [place] Heart-healthy meal planning includes:  Eating less unhealthy fats.  Eating more healthy fats.  Making other changes in your diet. Talk with your doctor or a diet specialist (dietitian) to create an eating plan that is right for you. What is my plan? Your doctor may recommend an eating plan that includes:  Total fat: ______% or less of total calories a day.  Saturated fat: ______% or less of total calories a day.  Cholesterol: less than _________mg a day. What are tips for following this plan? Cooking Avoid frying your food. Try to bake, boil,  grill, or broil it instead. You can also reduce fat by:  Removing the skin from poultry.  Removing all visible fats from meats.  Steaming vegetables in water or broth. Meal planning   At meals, divide your plate into four equal parts: ? Fill one-half of your plate with vegetables and green salads. ? Fill one-fourth of your plate with whole grains. ? Fill one-fourth of your plate with lean protein foods.  Eat 4-5 servings of vegetables per day. A serving of vegetables is: ? 1 cup of raw or cooked vegetables. ? 2 cups of raw leafy greens.  Eat 4-5 servings  of fruit per day. A serving of fruit is: ? 1 medium whole fruit. ?  cup of dried fruit. ?  cup of fresh, frozen, or canned fruit. ?  cup of 100% fruit juice.  Eat more foods that have soluble fiber. These are apples, broccoli, carrots, beans, peas, and barley. Try to get 20-30 g of fiber per day.  Eat 4-5 servings of nuts, legumes, and seeds per week: ? 1 serving of dried beans or legumes equals  cup after being cooked. ? 1 serving of nuts is  cup. ? 1 serving of seeds equals 1 tablespoon. General information  Eat more home-cooked food. Eat less restaurant, buffet, and fast food.  Limit or avoid alcohol.  Limit foods that are high in starch and sugar.  Avoid fried foods.  Lose weight if you are overweight.  Keep track of how much salt (sodium) you eat. This is important if you have high blood pressure. Ask your doctor to tell you more about this.  Try to add vegetarian meals each week. Fats  Choose healthy fats. These include olive oil and canola oil, flaxseeds, walnuts, almonds, and seeds.  Eat more omega-3 fats. These include salmon, mackerel, sardines, tuna, flaxseed oil, and ground flaxseeds. Try to eat fish at least 2 times each week.  Check food labels. Avoid foods with trans fats or high amounts of saturated fat.  Limit saturated fats. ? These are often found in animal products, such as meats,  butter, and cream. ? These are also found in plant foods, such as palm oil, palm kernel oil, and coconut oil.  Avoid foods with partially hydrogenated oils in them. These have trans fats. Examples are stick margarine, some tub margarines, cookies, crackers, and other baked goods. What foods can I eat? Fruits All fresh, canned (in natural juice), or frozen fruits. Vegetables Fresh or frozen vegetables (raw, steamed, roasted, or grilled). Green salads. Grains Most grains. Choose whole wheat and whole grains most of the time. Rice and pasta, including brown rice and pastas made with whole wheat. Meats and other proteins Lean, well-trimmed beef, veal, pork, and lamb. Chicken and [place] without skin. All fish and shellfish. Wild duck, rabbit, pheasant, and venison. Egg whites or low-cholesterol egg substitutes. Dried beans, peas, lentils, and tofu. Seeds and most nuts. Dairy Low-fat or nonfat cheeses, including ricotta and mozzarella. Skim or 1% milk that is liquid, powdered, or evaporated. Buttermilk that is made with low-fat milk. Nonfat or low-fat yogurt. Fats and oils Non-hydrogenated (trans-free) margarines. Vegetable oils, including soybean, sesame, sunflower, olive, peanut, safflower, corn, canola, and cottonseed. Salad dressings or mayonnaise made with a vegetable oil. Beverages Mineral water. Coffee and tea. Diet carbonated beverages. Sweets and desserts Sherbet, gelatin, and fruit ice. Small amounts of dark chocolate. Limit all sweets and desserts. Seasonings and condiments All seasonings and condiments. The items listed above may not be a complete list of foods and drinks you can eat. Contact a dietitian for more options. What foods should I avoid? Fruits Canned fruit in heavy syrup. Fruit in cream or butter sauce. Fried fruit. Limit coconut. Vegetables Vegetables cooked in cheese, cream, or butter sauce. Fried vegetables. Grains Breads that are made with saturated or trans fats,  oils, or whole milk. Croissants. Sweet rolls. Donuts. High-fat crackers, such as cheese crackers. Meats and other proteins Fatty meats, such as hot dogs, ribs, sausage, bacon, rib-eye roast or steak. High-fat deli meats, such as salami and bologna. Caviar. Domestic duck and goose. Organ meats, such as  liver. Dairy Cream, sour cream, cream cheese, and creamed cottage cheese. Whole-milk cheeses. Whole or 2% milk that is liquid, evaporated, or condensed. Whole buttermilk. Cream sauce or high-fat cheese sauce. Yogurt that is made from whole milk. Fats and oils Meat fat, or shortening. Cocoa butter, hydrogenated oils, palm oil, coconut oil, palm kernel oil. Solid fats and shortenings, including bacon fat, salt pork, lard, and butter. Nondairy cream substitutes. Salad dressings with cheese or sour cream. Beverages Regular sodas and juice drinks with added sugar. Sweets and desserts Frosting. Pudding. Cookies. Cakes. Pies. Milk chocolate or white chocolate. Buttered syrups. Full-fat ice cream or ice cream drinks. The items listed above may not be a complete list of foods and drinks to avoid. Contact a dietitian for more information. Summary  Heart-healthy meal planning includes eating less unhealthy fats, eating more healthy fats, and making other changes in your diet.  Eat a balanced diet. This includes fruits and vegetables, low-fat or nonfat dairy, lean protein, nuts and legumes, whole grains, and heart-healthy oils and fats. This information is not intended to replace advice given to you by your health care provider. Make sure you discuss any questions you have with your health care provider. Document Released: 08/24/2011 Document Revised: 04/01/2017 Document Reviewed: 04/01/2017 Elsevier Interactive Patient Education  2019 [employer].    Metformin tablets What is this medicine? METFORMIN (met FOR min) is used to treat type 2 diabetes. It helps to control blood sugar. Treatment is combined  with diet and exercise. This medicine can be used alone or with other medicines for diabetes. This medicine may be used for other purposes; ask your health care provider or pharmacist if you have questions. COMMON BRAND NAME(S): Glucophage What should I tell my health care provider before I take this medicine? They need to know if you have any of these conditions: -anemia -dehydration -heart disease -frequently drink alcohol-containing beverages -kidney disease -liver disease -polycystic ovary syndrome -serious infection or injury -vomiting -an unusual or allergic reaction to metformin, other medicines, foods, dyes, or preservatives -pregnant or trying to get pregnant -breast-feeding How should I use this medicine? Take this medicine by mouth with a glass of water. Follow the directions on the prescription label. Take this medicine with food. Take your medicine at regular intervals. Do not take your medicine more often than directed. Do not stop taking except on your doctor's advice. Talk to your pediatrician regarding the use of this medicine in children. While this drug may be prescribed for children as young as 95 years of age for selected conditions, precautions do apply. Overdosage: If you think you have taken too much of this medicine contact a poison control center or emergency room at once. NOTE: This medicine is only for you. Do not share this medicine with others. What if I miss a dose? If you miss a dose, take it as soon as you can. If it is almost time for your next dose, take only that dose. Do not take double or extra doses. What may interact with this medicine? Do not take this medicine with any of the following medications: -certain contrast medicines given before X-rays, CT scans, MRI, or other procedures -dofetilide This medicine may also interact with the following medications: -acetazolamide -alcohol -certain antivirals for HIV or hepatitis -certain medicines for  blood pressure, heart disease, irregular heart beat -cimetidine -dichlorphenamide -digoxin -diuretics -male hormones, like estrogens or progestins and birth control pills -glycopyrrolate -isoniazid -lamotrigine -memantine -methazolamide -metoclopramide -midodrine -niacin -phenothiazines like chlorpromazine, mesoridazine,  prochlorperazine, thioridazine -phenytoin -ranolazine -steroid medicines like prednisone or cortisone -stimulant medicines for attention disorders, weight loss, or to stay awake -thyroid medicines -topiramate -trospium -vandetanib -zonisamide This list may not describe all possible interactions. Give your health care provider a list of all the medicines, herbs, non-prescription drugs, or dietary supplements you use. Also tell them if you smoke, drink alcohol, or use illegal drugs. Some items may interact with your medicine. What should I watch for while using this medicine? Visit your doctor or health care professional for regular checks on your progress. A test called the HbA1C (A1C) will be monitored. This is a simple blood test. It measures your blood sugar control over the last 2 to 3 months. You will receive this test every 3 to 6 months. Learn how to check your blood sugar. Learn the symptoms of low and high blood sugar and how to manage them. Always carry a quick-source of sugar with you in case you have symptoms of low blood sugar. Examples include hard sugar candy or glucose tablets. Make sure others know that you can choke if you eat or drink when you develop serious symptoms of low blood sugar, such as seizures or unconsciousness. They must get medical help at once. Tell your doctor or health care professional if you have high blood sugar. You might need to change the dose of your medicine. If you are sick or exercising more than usual, you might need to change the dose of your medicine. Do not skip meals. Ask your doctor or health care professional if you  should avoid alcohol. Many nonprescription cough and cold products contain sugar or alcohol. These can affect blood sugar. This medicine may cause ovulation in premenopausal women who do not have regular monthly periods. This may increase your chances of becoming pregnant. You should not take this medicine if you become pregnant or think you may be pregnant. Talk with your doctor or health care professional about your birth control options while taking this medicine. Contact your doctor or health care professional right away if you think you are pregnant. If you are going to need surgery, a MRI, CT scan, or other procedure, tell your doctor that you are taking this medicine. You may need to stop taking this medicine before the procedure. Wear a medical ID bracelet or chain, and carry a card that describes your disease and details of your medicine and dosage times. This medicine may cause a decrease in folic acid and vitamin B12. You should make sure that you get enough vitamins while you are taking this medicine. Discuss the foods you eat and the vitamins you take with your health care professional. What side effects may I notice from receiving this medicine? Side effects that you should report to your doctor or health care professional as soon as possible: -allergic reactions like skin rash, itching or hives, swelling of the face, lips, or tongue -breathing problems -feeling faint or lightheaded, falls -muscle aches or pains -signs and symptoms of low blood sugar such as feeling anxious, confusion, dizziness, increased hunger, unusually weak or tired, sweating, shakiness, cold, irritable, headache, blurred vision, fast heartbeat, loss of consciousness -slow or irregular heartbeat -unusual stomach pain or discomfort -unusually tired or weak Side effects that usually do not require medical attention (report to your doctor or health care professional if they continue or are bothersome): -diarrhea  -headache -heartburn -metallic taste in mouth -nausea -stomach gas, upset This list may not describe all possible side effects. Call your  doctor for medical advice about side effects. You may report side effects to FDA at 1-800-FDA-1088. Where should I keep my medicine? Keep out of the reach of children. Store at room temperature between 15 and 30 degrees C (59 and 86 degrees F). Protect from moisture and light. Throw away any unused medicine after the expiration date. NOTE: This sheet is a summary. It may not cover all possible information. If you have questions about this medicine, talk to your doctor, pharmacist, or health care provider.  2019 Elsevier/Gold Standard (2017-03-31 19:15:19) Glipizide tablets What is this medicine? GLIPIZIDE (GLIP i zide) helps to treat type 2 diabetes. Treatment is combined with diet and exercise. The medicine helps your body to use insulin better. This medicine may be used for other purposes; ask your health care provider or pharmacist if you have questions. COMMON BRAND NAME(S): Glucotrol What should I tell my health care provider before I take this medicine? They need to know if you have any of these conditions: -diabetic ketoacidosis -glucose-6-phosphate dehydrogenase deficiency -heart disease -kidney disease -liver disease -porphyria -severe infection or injury -thyroid disease -an unusual or allergic reaction to glipizide, sulfa drugs, other medicines, foods, dyes, or preservatives -pregnant or trying to get pregnant -breast-feeding How should I use this medicine? Take this medicine by mouth. Swallow with a drink of water. Do not take with food. Take it 30 minutes before a meal. Follow the directions on the prescription label. If you take this medicine once a day, take it 30 minutes before breakfast. Take your doses at the same time each day. Do not take more often than directed. Talk to your pediatrician regarding the use of this medicine in  children. Special care may be needed. Elderly patients over 6 years old may have a stronger reaction and need a smaller dose. Overdosage: If you think you have taken too much of this medicine contact a poison control center or emergency room at once. NOTE: This medicine is only for you. Do not share this medicine with others. What if I miss a dose? If you miss a dose, take it as soon as you can. If it is almost time for your next dose, take only that dose. Do not take double or extra doses. What may interact with this medicine? -bosentan -chloramphenicol -cisapride -clarithromycin -medicines for fungal or yeast infections -metoclopramide -probenecid -warfarin Many medications may cause an increase or decrease in blood sugar, these include: -alcohol containing beverages -aspirin and aspirin-like drugs -chloramphenicol -chromium -diuretics -male hormones, like estrogens or progestins and birth control pills -heart medicines -isoniazid -male hormones or anabolic steroids -medicines for weight loss -medicines for allergies, asthma, cold, or cough -medicines for mental problems -medicines called MAO Inhibitors like Nardil, Parnate, Marplan, Eldepryl -niacin -NSAIDs, medicines for pain and inflammation, like ibuprofen or naproxen -pentamidine -phenytoin -probenecid -quinolone antibiotics like ciprofloxacin, levofloxacin, ofloxacin -some herbal dietary supplements -steroid medicines like prednisone or cortisone -thyroid medicine This list may not describe all possible interactions. Give your health care provider a list of all the medicines, herbs, non-prescription drugs, or dietary supplements you use. Also tell them if you smoke, drink alcohol, or use illegal drugs. Some items may interact with your medicine. What should I watch for while using this medicine? Visit your doctor or health care professional for regular checks on your progress. A test called the HbA1C (A1C) will be  monitored. This is a simple blood test. It measures your blood sugar control over the last 2 to 3 months. You  will receive this test every 3 to 6 months. Learn how to check your blood sugar. Learn the symptoms of low and high blood sugar and how to manage them. Always carry a quick-source of sugar with you in case you have symptoms of low blood sugar. Examples include hard sugar candy or glucose tablets. Make sure others know that you can choke if you eat or drink when you develop serious symptoms of low blood sugar, such as seizures or unconsciousness. They must get medical help at once. Tell your doctor or health care professional if you have high blood sugar. You might need to change the dose of your medicine. If you are sick or exercising more than usual, you might need to change the dose of your medicine. Do not skip meals. Ask your doctor or health care professional if you should avoid alcohol. Many nonprescription cough and cold products contain sugar or alcohol. These can affect blood sugar. This medicine can make you more sensitive to the sun. Keep out of the sun. If you cannot avoid being in the sun, wear protective clothing and use sunscreen. Do not use sun lamps or tanning beds/booths. Wear a medical ID bracelet or chain, and carry a card that describes your disease and details of your medicine and dosage times. What side effects may I notice from receiving this medicine? Side effects that you should report to your doctor or health care professional as soon as possible: -allergic reactions like skin rash, itching or hives, swelling of the face, lips, or tongue -breathing problems -dark urine -fever, chills, sore throat -signs and symptoms of low blood sugar such as feeling anxious, confusion, dizziness, increased hunger, unusually weak or tired, sweating, shakiness, cold, irritable, headache, blurred vision, fast heartbeat, loss of consciousness -unusual bleeding or bruising -yellowing of  the eyes or skin Side effects that usually do not require medical attention (report to your doctor or health care professional if they continue or are bothersome): -diarrhea -dizziness -headache -heartburn -nausea -stomach gas This list may not describe all possible side effects. Call your doctor for medical advice about side effects. You may report side effects to FDA at 1-800-FDA-1088. Where should I keep my medicine? Keep out of the reach of children. Store at room temperature below 30 degrees C (86 degrees F). Throw away any unused medicine after the expiration date. NOTE: This sheet is a summary. It may not cover all possible information. If you have questions about this medicine, talk to your doctor, pharmacist, or health care provider.  2019 Elsevier/Gold Standard (2012-06-07 14:42:46)

## 2018-08-04 NOTE — Progress Notes (Signed)
[facility]   Established Patient Office Visit  Subjective:  Patient ID: Noah Bates, male    DOB: 01/02/1976  Age: 43 y.o. MRN: [id number]  CC:  Chief Complaint  Patient presents with   Follow-up    chronic condition     HPI Noah Bates is a 43 year old male who presents for Follow up today.   Past Medical History:  Diagnosis Date   Diabetes mellitus without complication (HCC)    Hypertension    Current Status: Since his last office visit, he has not been checking his preprandial blood glucose levels. He denies fatigue, frequent urination, blurred vision, excessive hunger, excessive thirst, weight gain, weight loss, and poor wound healing. He does not check his feet regularly.  He state that he has not been taking Lisinopril X 3 months now. He denies visual changes, chest pain, cough, shortness of breath, heart palpitations, and falls. He has occasional headaches and dizziness with position changes. Denies severe headaches, confusion, seizures, double vision, and blurred vision, nausea and vomiting.  He denies fevers, chills, fatigue, recent infections, weight loss, and night sweats. No reports of GI problems such as diarrhea, and constipation. He has no reports of blood in stools, dysuria and hematuria. No depression or anxiety reported. He denies pain today.   Past Surgical History:  Procedure Laterality Date   BACK SURGERY      Family History  Problem Relation Age of Onset   Diabetes Mellitus II Mother    Diabetes Mellitus II Father     Social History   Socioeconomic History   Marital status: Married    Spouse name: Not on file   Number of children: Not on file   Years of education: Not on file   Highest education level: Not on file  Occupational History   Not on file  Social Needs   Financial resource strain: Not on file   Food insecurity:    Worry: Not on file    Inability: Not  on file   Transportation needs:    Medical: Not on file    Non-medical: Not on file  Tobacco Use   Smoking status: Never Smoker   Smokeless tobacco: Never Used  Substance and Sexual Activity   Alcohol use: Not Currently   Drug use: Never   Sexual activity: Not on file  Lifestyle   Physical activity:    Days per week: Not on file    Minutes per session: Not on file   Stress: Not on file  Relationships   Social connections:    Talks on phone: Not on file    Gets together: Not on file    Attends religious service: Not on file    Active member of club or organization: Not on file    Attends meetings of clubs or organizations: Not on file    Relationship status: Not on file   Intimate partner violence:    Fear of current or ex partner: Not on file    Emotionally abused: Not on file    Physically abused: Not on file    Forced sexual activity: Not on file  Other Topics Concern   Not on file  Social History Narrative   Not on file    Outpatient Medications Prior to Visit  Medication Sig Dispense Refill   aspirin EC 81 MG tablet Take 81 mg by mouth daily.     levocetirizine (XYZAL) 5 MG tablet  Take 1 tablet (5 mg total) by mouth every evening. 30 tablet 5   amLODipine (NORVASC) 10 MG tablet Take 1 tablet (10 mg total) by mouth daily. 90 tablet 3   metFORMIN (GLUCOPHAGE) 500 MG tablet Take 1 tablet (500 mg total) by mouth 2 (two) times daily with a meal. 60 tablet 6   fluticasone (FLONASE) 50 MCG/ACT nasal spray Place 2 sprays into both nostrils daily. (Patient not taking: Reported on 08/04/2018) 16 g 6   ibuprofen (ADVIL,MOTRIN) 800 MG tablet Take 1 tablet (800 mg total) by mouth every 8 (eight) hours as needed. (Patient not taking: Reported on 08/04/2018) 30 tablet 3   lisinopril (PRINIVIL,ZESTRIL) 20 MG tablet Take 1 tablet (20 mg total) by mouth daily. (Patient not taking: Reported on 08/04/2018) 30 tablet 2   No facility-administered medications prior to visit.      No Known Allergies  ROS Review of Systems  Constitutional: Negative.   Eyes: Negative.   Respiratory: Negative.   Cardiovascular: Negative.   Gastrointestinal: Negative.   Endocrine: Negative.   Genitourinary: Negative.   Musculoskeletal: Negative.   Skin: Negative.   Neurological: Positive for dizziness and headaches.  Hematological: Negative.   Psychiatric/Behavioral: Negative.    Objective:    Physical Exam  Constitutional: He is oriented to person, place, and time. He appears well-developed and well-nourished.  HENT:  Head: Normocephalic and atraumatic.  Eyes: Conjunctivae are normal.  Neck: Normal range of motion. Neck supple.  Cardiovascular: Normal rate, normal heart sounds and intact distal pulses.  Pulmonary/Chest: Effort normal and breath sounds normal.  Abdominal: Soft. Bowel sounds are normal.  Musculoskeletal: Normal range of motion.  Neurological: He is alert and oriented to person, place, and time. He has normal reflexes.  Skin: Skin is warm and dry.  Psychiatric: He has a normal mood and affect. His behavior is normal. Judgment and thought content normal.  Nursing note and vitals reviewed.   BP (!) 158/84    Pulse 90    Temp 98.2 F (36.8 C) (Oral)    Ht  (2.007 m)    Wt 250 lb 3.2 oz (113.5 kg)    SpO2 100%    BMI 28.19 kg/m  Wt Readings from Last 3 Encounters:  08/04/18 250 lb 3.2 oz (113.5 kg)  04/03/18 238 lb (108 kg)  02/07/18 241 lb (109.3 kg)     There are no preventive care reminders to display for this patient.  There are no preventive care reminders to display for this patient.  No results found for: TSH Lab Results  Component Value Date   WBC 6.4 01/08/2018   HGB 13.2 01/08/2018   HCT 41.3 01/08/2018   MCV 81.3 01/08/2018   PLT 179 01/08/2018   Lab Results  Component Value Date   NA 140 01/08/2018   K 4.0 01/08/2018   CO2 27 01/08/2018   GLUCOSE 169 (H) 01/08/2018   BUN 9 01/08/2018   CREATININE 0.77 01/08/2018    BILITOT 1.6 (H) 01/08/2018   ALKPHOS 48 01/08/2018   AST 16 01/08/2018   ALT 14 01/08/2018   PROT 6.4 (L) 01/08/2018   ALBUMIN 4.2 01/08/2018   CALCIUM 9.2 01/08/2018   ANIONGAP 8 01/08/2018   No results found for: CHOL No results found for: HDL No results found for: LDLCALC No results found for: TRIG No results found for: CHOLHDL Lab Results  Component Value Date   HGBA1C 9.9 (A) 08/04/2018   Assessment & Plan:   1. Type 2  diabetes mellitus with hyperglycemia, without long-term current use of insulin (HCC) Hgb A1c is elevated at 9.9 today, from 8.3 on 03/31/2018. He we take all medications as prescribed. He will monitor blood glucose levels 3 times a day and keep a diary of results. He will continue to decrease foods/beverages high in sugars and carbs and follow Heart Healthy or DASH diet. Increase physical activity to at least 30 minutes cardio exercise daily.  - POCT glycosylated hemoglobin (Hb A1C) - POCT urinalysis dipstick - metFORMIN (GLUCOPHAGE) 1000 MG tablet; Take 1 tablet (1,000 mg total) by mouth 2 (two) times daily with a meal.  Dispense: 180 tablet; Refill: 3 - glipiZIDE (GLUCOTROL) 10 MG tablet; Take 1 tablet (10 mg total) by mouth 2 (two) times daily before a meal.  Dispense: 60 tablet; Refill: 3  2. Elevated glucose - Glucose (CBG) - metFORMIN (GLUCOPHAGE) 1000 MG tablet; Take 1 tablet (1,000 mg total) by mouth 2 (two) times daily with a meal.  Dispense: 180 tablet; Refill: 3 - glipiZIDE (GLUCOTROL) 10 MG tablet; Take 1 tablet (10 mg total) by mouth 2 (two) times daily before a meal.  Dispense: 60 tablet; Refill: 3  3. Essential hypertension Blood pressures are elevated today. Clonidine 0.1 mg given to patient in office and blood pressures remain stabilize. He denies severe headaches, confusion, seizures, double vision, and blurred vision, nausea and vomiting. He will report to ED if he experiences these symptoms. Patient verbalized understanding.   - cloNIDine  (CATAPRES) tablet 0.1 mg - hydrochlorothiazide (HYDRODIURIL) 25 MG tablet; Take 1 tablet (25 mg total) by mouth daily.  Dispense: 90 tablet; Refill: 3 - amLODipine (NORVASC) 10 MG tablet; Take 1 tablet (10 mg total) by mouth daily.  Dispense: 90 tablet; Refill: 3 - lisinopril (ZESTRIL) 20 MG tablet; Take 1 tablet (20 mg total) by mouth daily.  Dispense: 90 tablet; Refill: 2  4. Abnormal urinalysis Results are pending.  - Urine Culture  5. Follow up He will follow up in 1 week for blood pressure check. He will follow up in 1 month for office visit.   Meds ordered this encounter  Medications   cloNIDine (CATAPRES) tablet 0.1 mg   hydrochlorothiazide (HYDRODIURIL) 25 MG tablet    Sig: Take 1 tablet (25 mg total) by mouth daily.    Dispense:  90 tablet    Refill:  3   amLODipine (NORVASC) 10 MG tablet    Sig: Take 1 tablet (10 mg total) by mouth daily.    Dispense:  90 tablet    Refill:  3   lisinopril (ZESTRIL) 20 MG tablet    Sig: Take 1 tablet (20 mg total) by mouth daily.    Dispense:  90 tablet    Refill:  2   metFORMIN (GLUCOPHAGE) 1000 MG tablet    Sig: Take 1 tablet (1,000 mg total) by mouth 2 (two) times daily with a meal.    Dispense:  180 tablet    Refill:  3   glipiZIDE (GLUCOTROL) 10 MG tablet    Sig: Take 1 tablet (10 mg total) by mouth 2 (two) times daily before a meal.    Dispense:  60 tablet    Refill:  3    Orders Placed This Encounter  Procedures   Urine Culture   POCT glycosylated hemoglobin (Hb A1C)   POCT urinalysis dipstick   Glucose (CBG)    Referral Orders  No referral(s) requested today    Raliegh Ip,  MSN, FNP-BC [facility] [address] [place], [place] [address] [phone]    Problem List Items Addressed This Visit      Cardiovascular and Mediastinum   Essential hypertension   Relevant Medications   cloNIDine (CATAPRES) tablet 0.1 mg (Completed)   hydrochlorothiazide  (HYDRODIURIL) 25 MG tablet   amLODipine (NORVASC) 10 MG tablet   lisinopril (ZESTRIL) 20 MG tablet     Endocrine   Type 2 diabetes mellitus with hyperglycemia (HCC) - Primary   Relevant Medications   lisinopril (ZESTRIL) 20 MG tablet   metFORMIN (GLUCOPHAGE) 1000 MG tablet   glipiZIDE (GLUCOTROL) 10 MG tablet   Other Relevant Orders   POCT glycosylated hemoglobin (Hb A1C) (Completed)   POCT urinalysis dipstick (Completed)    Other Visit Diagnoses    Elevated glucose       Relevant Medications   metFORMIN (GLUCOPHAGE) 1000 MG tablet   glipiZIDE (GLUCOTROL) 10 MG tablet   Other Relevant Orders   Glucose (CBG) (Completed)   Abnormal urinalysis       Relevant Orders   Urine Culture (Completed)   Follow up          Meds ordered this encounter  Medications   cloNIDine (CATAPRES) tablet 0.1 mg   hydrochlorothiazide (HYDRODIURIL) 25 MG tablet    Sig: Take 1 tablet (25 mg total) by mouth daily.    Dispense:  90 tablet    Refill:  3   amLODipine (NORVASC) 10 MG tablet    Sig: Take 1 tablet (10 mg total) by mouth daily.    Dispense:  90 tablet    Refill:  3   lisinopril (ZESTRIL) 20 MG tablet    Sig: Take 1 tablet (20 mg total) by mouth daily.    Dispense:  90 tablet    Refill:  2   metFORMIN (GLUCOPHAGE) 1000 MG tablet    Sig: Take 1 tablet (1,000 mg total) by mouth 2 (two) times daily with a meal.    Dispense:  180 tablet    Refill:  3   glipiZIDE (GLUCOTROL) 10 MG tablet    Sig: Take 1 tablet (10 mg total) by mouth 2 (two) times daily before a meal.    Dispense:  60 tablet    Refill:  3    Follow-up: No follow-ups on file.    Kallie Locks, FNP

## 2018-08-06 LAB — URINE CULTURE: Organism ID, Bacteria: NO GROWTH

## 2018-08-07 NOTE — Telephone Encounter (Signed)
Patient notified and will keep a log

## 2018-08-07 NOTE — Telephone Encounter (Signed)
-----   Message from Kallie Locks, FNP sent at 08/06/2018  6:54 AM EDT ----- Regarding: "Remainder" Please remind patient to monitor his blood glucose levels 3 times a day before meals, and at bedtime. He will need to keep a log of these levels and bring to his next office appointment for review. Thank you.

## 2018-08-11 ENCOUNTER — Ambulatory Visit: Payer: Self-pay

## 2018-08-11 ENCOUNTER — Other Ambulatory Visit: Payer: Self-pay

## 2018-08-11 VITALS — BP 110/74

## 2018-08-11 DIAGNOSIS — I10 Essential (primary) hypertension: Secondary | ICD-10-CM

## 2018-09-04 ENCOUNTER — Ambulatory Visit: Payer: Self-pay | Admitting: Family Medicine

## 2018-09-13 ENCOUNTER — Ambulatory Visit: Payer: Self-pay | Admitting: Family Medicine

## 2018-09-19 ENCOUNTER — Other Ambulatory Visit: Payer: Self-pay

## 2018-09-19 ENCOUNTER — Encounter: Payer: Self-pay | Admitting: Family Medicine

## 2018-09-19 ENCOUNTER — Ambulatory Visit (INDEPENDENT_AMBULATORY_CARE_PROVIDER_SITE_OTHER): Payer: Self-pay | Admitting: Family Medicine

## 2018-09-19 VITALS — BP 140/86 | HR 80 | Temp 98.4°F | Ht 79.0 in | Wt 248.0 lb

## 2018-09-19 DIAGNOSIS — E1165 Type 2 diabetes mellitus with hyperglycemia: Secondary | ICD-10-CM

## 2018-09-19 DIAGNOSIS — Z09 Encounter for follow-up examination after completed treatment for conditions other than malignant neoplasm: Secondary | ICD-10-CM

## 2018-09-19 DIAGNOSIS — I10 Essential (primary) hypertension: Secondary | ICD-10-CM

## 2018-09-19 LAB — POCT URINALYSIS DIP (MANUAL ENTRY)
Bilirubin, UA: NEGATIVE
Glucose, UA: NEGATIVE mg/dL
Ketones, POC UA: NEGATIVE mg/dL
Leukocytes, UA: NEGATIVE
Nitrite, UA: NEGATIVE
Protein Ur, POC: NEGATIVE mg/dL
Spec Grav, UA: >=1.03 — AB (ref 1.010–1.025)
Urobilinogen, UA: 0.2 E.U./dL
pH, UA: 5.5 (ref 5.0–8.0)

## 2018-09-19 LAB — POCT GLYCOSYLATED HEMOGLOBIN (HGB A1C): Hemoglobin A1C: 8.6 % — AB (ref 4.0–5.6)

## 2018-09-19 NOTE — Progress Notes (Signed)
[facility]   Established Patient Office Visit  Subjective:  Patient ID: Noah Bates, male    DOB: 22-Sep-1975  Age: 43 y.o. MRN: [id number]  CC: No chief complaint on file.   HPI Noah Bates is a 43 year old male who presents for follow up today.   Past Medical History:  Diagnosis Date  . Diabetes mellitus without complication (HCC)   . Hypertension    Current Status: Since his last office visit, he is doing well with no complaints. He denies visual changes, chest pain, cough, shortness of breath, heart palpitations, and falls. He has occasional headaches and dizziness with position changes. Denies severe headaches, confusion, seizures, double vision, and blurred vision, nausea and vomiting. He does not regularly check his blood glucose levels. He has seen low range of 110 and high of 190 since his last office visit. He denies fatigue, frequent urination, blurred vision, excessive hunger, excessive thirst, weight gain, weight loss, and poor wound healing. He continues to check his feet regularly.   He denies fevers, chills, recent infections, weight loss, and night sweats. No reports of GI problems such as diarrhea, and constipation. He has no reports of blood in stools, dysuria and hematuria. No depression or anxiety reported. He denies pain today.   Past Surgical History:  Procedure Laterality Date  . BACK SURGERY      Family History  Problem Relation Age of Onset  . Diabetes Mellitus II Mother   . Diabetes Mellitus II Father     Social History   Socioeconomic History  . Marital status: Married    Spouse name: Not on file  . Number of children: Not on file  . Years of education: Not on file  . Highest education level: Not on file  Occupational History  . Not on file  Social Needs  . Financial resource strain: Not on file  . Food insecurity    Worry: Not on file    Inability: Not on file  .  Transportation needs    Medical: Not on file    Non-medical: Not on file  Tobacco Use  . Smoking status: Never Smoker  . Smokeless tobacco: Never Used  Substance and Sexual Activity  . Alcohol use: Not Currently  . Drug use: Never  . Sexual activity: Not on file  Lifestyle  . Physical activity    Days per week: Not on file    Minutes per session: Not on file  . Stress: Not on file  Relationships  . Social Musicianconnections    Talks on phone: Not on file    Gets together: Not on file    Attends religious service: Not on file    Active member of club or organization: Not on file    Attends meetings of clubs or organizations: Not on file    Relationship status: Not on file  . Intimate partner violence    Fear of current or ex partner: Not on file    Emotionally abused: Not on file    Physically abused: Not on file    Forced sexual activity: Not on file  Other Topics Concern  . Not on file  Social History Narrative  . Not on file    Outpatient Medications Prior to Visit  Medication Sig Dispense Refill  . amLODipine (NORVASC) 10 MG tablet Take 1 tablet (10 mg total) by mouth daily. 90 tablet 3  . aspirin EC 81 MG tablet Take  81 mg by mouth daily.    . fluticasone (FLONASE) 50 MCG/ACT nasal spray Place 2 sprays into both nostrils daily. (Patient not taking: Reported on 08/04/2018) 16 g 6  . glipiZIDE (GLUCOTROL) 10 MG tablet Take 1 tablet (10 mg total) by mouth 2 (two) times daily before a meal. 60 tablet 3  . hydrochlorothiazide (HYDRODIURIL) 25 MG tablet Take 1 tablet (25 mg total) by mouth daily. 90 tablet 3  . ibuprofen (ADVIL,MOTRIN) 800 MG tablet Take 1 tablet (800 mg total) by mouth every 8 (eight) hours as needed. (Patient not taking: Reported on 08/04/2018) 30 tablet 3  . levocetirizine (XYZAL) 5 MG tablet Take 1 tablet (5 mg total) by mouth every evening. 30 tablet 5  . lisinopril (ZESTRIL) 20 MG tablet Take 1 tablet (20 mg total) by mouth daily. 90 tablet 2  . metFORMIN  (GLUCOPHAGE) 1000 MG tablet Take 1 tablet (1,000 mg total) by mouth 2 (two) times daily with a meal. 180 tablet 3   No facility-administered medications prior to visit.     No Known Allergies  ROS Review of Systems  Constitutional: Negative.   HENT: Negative.   Eyes: Negative.   Respiratory: Negative.   Cardiovascular: Negative.   Gastrointestinal: Negative.   Endocrine: Negative.   Genitourinary: Negative.   Musculoskeletal: Negative.   Skin: Negative.   Allergic/Immunologic: Negative.   Neurological: Positive for dizziness (occasional ) and headaches (occasional).  Hematological: Negative.   Psychiatric/Behavioral: Negative.    Objective:    Physical Exam  Constitutional: He is oriented to person, place, and time and well-developed, well-nourished, and in no distress.  HENT:  Head: Normocephalic and atraumatic.  Eyes: Conjunctivae are normal.  Neck: Normal range of motion. Neck supple.  Cardiovascular: Normal rate, regular rhythm, normal heart sounds and intact distal pulses.  Pulmonary/Chest: Effort normal and breath sounds normal.  Abdominal: Soft. Bowel sounds are normal.  Musculoskeletal: Normal range of motion.  Neurological: He is alert and oriented to person, place, and time.  Skin: Skin is warm and dry.  Psychiatric: Mood, memory, affect and judgment normal.  Nursing note and vitals reviewed.  There were no vitals taken for this visit. Wt Readings from Last 3 Encounters:  08/04/18 250 lb 3.2 oz (113.5 kg)  04/03/18 238 lb (108 kg)  02/07/18 241 lb (109.3 kg)    BP Readings from Last 3 Encounters:  09/19/18 140/86  08/11/18 110/74  08/04/18 (!) 158/84     There are no preventive care reminders to display for this patient.  There are no preventive care reminders to display for this patient.  No results found for: TSH Lab Results  Component Value Date   WBC 6.4 01/08/2018   HGB 13.2 01/08/2018   HCT 41.3 01/08/2018   MCV 81.3 01/08/2018   PLT  179 01/08/2018   Lab Results  Component Value Date   NA 140 01/08/2018   K 4.0 01/08/2018   CO2 27 01/08/2018   GLUCOSE 169 (H) 01/08/2018   BUN 9 01/08/2018   CREATININE 0.77 01/08/2018   BILITOT 1.6 (H) 01/08/2018   ALKPHOS 48 01/08/2018   AST 16 01/08/2018   ALT 14 01/08/2018   PROT 6.4 (L) 01/08/2018   ALBUMIN 4.2 01/08/2018   CALCIUM 9.2 01/08/2018   ANIONGAP 8 01/08/2018   No results found for: CHOL No results found for: HDL No results found for: LDLCALC No results found for: TRIG No results found for: [facility] Lab Results  Component Value Date   HGBA1C  9.9 (A) 08/04/2018   Assessment & Plan:   1. Type 2 diabetes mellitus with hyperglycemia, without long-term current use of insulin (HCC) The current medical regimen is effective; Hgb A1c is stable at 8.6 today;continue present plan and medications as prescribed. He will continue to decrease foods/beverages high in sugars and carbs and follow Heart Healthy or DASH diet. Increase physical activity to at least 30 minutes cardio exercise daily.  - POCT glycosylated hemoglobin (Hb A1C) - POCT urinalysis dipstick  2. Essential hypertension The current medical regimen is effective; blood pressure is stable at 140/86 today; continue present plan and medications as prescribed. He will continue to decrease high sodium intake, excessive alcohol intake, increase potassium intake, smoking cessation, and increase physical activity of at least 30 minutes of cardio activity daily. He will continue to follow Heart Healthy or DASH diet.  3. Follow up Follow up with labs only in 3 months (Hgb A1c). Follow up in 6 months for office visit.   No orders of the defined types were placed in this encounter.  Orders Placed This Encounter  Procedures  . POCT glycosylated hemoglobin (Hb A1C)  . POCT urinalysis dipstick   Referral Orders  No referral(s) requested today    Kathe Becton,  MSN, FNP-BC [facility] [address] [place], [place] [address] [phone]   Problem List Items Addressed This Visit    None      No orders of the defined types were placed in this encounter.   Follow-up: No follow-ups on file.    Azzie Glatter, FNP

## 2018-11-29 ENCOUNTER — Other Ambulatory Visit: Payer: Self-pay | Admitting: Family Medicine

## 2018-11-29 DIAGNOSIS — G43909 Migraine, unspecified, not intractable, without status migrainosus: Secondary | ICD-10-CM

## 2018-12-20 ENCOUNTER — Other Ambulatory Visit: Payer: Self-pay

## 2018-12-20 ENCOUNTER — Other Ambulatory Visit (INDEPENDENT_AMBULATORY_CARE_PROVIDER_SITE_OTHER): Payer: Self-pay

## 2018-12-20 ENCOUNTER — Telehealth: Payer: Self-pay

## 2018-12-20 DIAGNOSIS — E1165 Type 2 diabetes mellitus with hyperglycemia: Secondary | ICD-10-CM

## 2018-12-20 LAB — POCT GLYCOSYLATED HEMOGLOBIN (HGB A1C): Hemoglobin A1C: 8.3 % — AB (ref 4.0–5.6)

## 2018-12-20 NOTE — Telephone Encounter (Signed)
Patient came in for a labs only visit today for Hgba1c and it was 8.3%. Just wanted to let you know. Is it ok to tell patient, do you want to make any changes with his medication regiment? Please advise.

## 2018-12-21 NOTE — Telephone Encounter (Signed)
Called and spoke with patient, advised that hgba1c was 8.3% and no changes are being made to medications. Thanks!

## 2019-01-03 ENCOUNTER — Other Ambulatory Visit: Payer: Self-pay | Admitting: Family Medicine

## 2019-01-03 DIAGNOSIS — E1165 Type 2 diabetes mellitus with hyperglycemia: Secondary | ICD-10-CM

## 2019-01-03 DIAGNOSIS — G43909 Migraine, unspecified, not intractable, without status migrainosus: Secondary | ICD-10-CM

## 2019-01-03 DIAGNOSIS — R7309 Other abnormal glucose: Secondary | ICD-10-CM

## 2019-01-04 ENCOUNTER — Other Ambulatory Visit: Payer: Self-pay

## 2019-01-04 ENCOUNTER — Telehealth: Payer: Self-pay | Admitting: Family Medicine

## 2019-01-04 DIAGNOSIS — R7309 Other abnormal glucose: Secondary | ICD-10-CM

## 2019-01-04 DIAGNOSIS — E1165 Type 2 diabetes mellitus with hyperglycemia: Secondary | ICD-10-CM

## 2019-01-04 NOTE — Telephone Encounter (Signed)
Med are already filled  I called patient to let them know they are ready for pickup at his pharmacy

## 2019-02-11 ENCOUNTER — Other Ambulatory Visit: Payer: Self-pay | Admitting: Family Medicine

## 2019-02-11 DIAGNOSIS — R7309 Other abnormal glucose: Secondary | ICD-10-CM

## 2019-02-11 DIAGNOSIS — E1165 Type 2 diabetes mellitus with hyperglycemia: Secondary | ICD-10-CM

## 2019-02-11 DIAGNOSIS — G43909 Migraine, unspecified, not intractable, without status migrainosus: Secondary | ICD-10-CM

## 2019-03-19 ENCOUNTER — Ambulatory Visit: Payer: Self-pay | Admitting: Family Medicine

## 2019-04-09 ENCOUNTER — Other Ambulatory Visit: Payer: Self-pay | Admitting: Family Medicine

## 2019-04-09 DIAGNOSIS — R7309 Other abnormal glucose: Secondary | ICD-10-CM

## 2019-04-09 DIAGNOSIS — G43909 Migraine, unspecified, not intractable, without status migrainosus: Secondary | ICD-10-CM

## 2019-04-09 DIAGNOSIS — E1165 Type 2 diabetes mellitus with hyperglycemia: Secondary | ICD-10-CM

## 2019-05-07 DIAGNOSIS — E785 Hyperlipidemia, unspecified: Secondary | ICD-10-CM

## 2019-05-07 DIAGNOSIS — E559 Vitamin D deficiency, unspecified: Secondary | ICD-10-CM

## 2019-05-07 HISTORY — DX: Vitamin D deficiency, unspecified: E55.9

## 2019-05-07 HISTORY — DX: Hyperlipidemia, unspecified: E78.5

## 2019-05-28 ENCOUNTER — Other Ambulatory Visit: Payer: Self-pay | Admitting: Family Medicine

## 2019-05-28 DIAGNOSIS — E1165 Type 2 diabetes mellitus with hyperglycemia: Secondary | ICD-10-CM

## 2019-05-28 DIAGNOSIS — G43909 Migraine, unspecified, not intractable, without status migrainosus: Secondary | ICD-10-CM

## 2019-05-28 DIAGNOSIS — R7309 Other abnormal glucose: Secondary | ICD-10-CM

## 2019-05-28 DIAGNOSIS — I10 Essential (primary) hypertension: Secondary | ICD-10-CM

## 2019-06-01 ENCOUNTER — Ambulatory Visit (INDEPENDENT_AMBULATORY_CARE_PROVIDER_SITE_OTHER): Payer: Self-pay | Admitting: Family Medicine

## 2019-06-01 ENCOUNTER — Other Ambulatory Visit: Payer: Self-pay

## 2019-06-01 ENCOUNTER — Encounter: Payer: Self-pay | Admitting: Family Medicine

## 2019-06-01 VITALS — BP 120/73 | HR 94 | Temp 98.3°F | Ht 79.0 in | Wt 253.4 lb

## 2019-06-01 DIAGNOSIS — E1165 Type 2 diabetes mellitus with hyperglycemia: Secondary | ICD-10-CM

## 2019-06-01 DIAGNOSIS — R739 Hyperglycemia, unspecified: Secondary | ICD-10-CM | POA: Insufficient documentation

## 2019-06-01 DIAGNOSIS — R8290 Unspecified abnormal findings in urine: Secondary | ICD-10-CM

## 2019-06-01 DIAGNOSIS — I10 Essential (primary) hypertension: Secondary | ICD-10-CM

## 2019-06-01 DIAGNOSIS — R7309 Other abnormal glucose: Secondary | ICD-10-CM | POA: Insufficient documentation

## 2019-06-01 DIAGNOSIS — Z09 Encounter for follow-up examination after completed treatment for conditions other than malignant neoplasm: Secondary | ICD-10-CM

## 2019-06-01 LAB — POCT GLYCOSYLATED HEMOGLOBIN (HGB A1C): Hemoglobin A1C: 11.6 % — AB (ref 4.0–5.6)

## 2019-06-01 LAB — POCT URINALYSIS DIPSTICK
Bilirubin, UA: NEGATIVE
Glucose, UA: POSITIVE — AB
Ketones, UA: NEGATIVE
Leukocytes, UA: NEGATIVE
Nitrite, UA: NEGATIVE
Protein, UA: NEGATIVE
Spec Grav, UA: 1.015 (ref 1.010–1.025)
Urobilinogen, UA: 1 E.U./dL
pH, UA: 5.5 (ref 5.0–8.0)

## 2019-06-01 LAB — GLUCOSE, POCT (MANUAL RESULT ENTRY): POC Glucose: 432 mg/dl — AB (ref 70–99)

## 2019-06-01 NOTE — Progress Notes (Signed)
[facility]    Established Patient Office Visit  Subjective:  Patient ID: Noah Bates, male    DOB: 08-05-1975  Age: 44 y.o. MRN: [id number]  CC:  Chief Complaint  Patient presents with  . Follow-up    DM2    HPI Noah Bates is a 44 yr old male who presents for Follow Up today.   Past Medical History:  Diagnosis Date  . Diabetes mellitus without complication (HCC)   . Hypertension     Patient Active Problem List   Diagnosis Date Noted  . SIRS (systemic inflammatory response syndrome) (HCC) 08/31/2017  . Essential hypertension 08/31/2017  . Type 2 diabetes mellitus with hyperglycemia (HCC) 08/31/2017   Current Status: Since his last office visit, he has c/o bilateral leg cramps X 1 months. He admits to not eating healthy for the past few months. He has not been monitoring his blood glucose levels lately. He denies fatigue, frequent urination, blurred vision, excessive hunger, excessive thirst, weight gain, weight loss, and poor wound healing. He continues to check his feet regularly. He denies visual changes, chest pain, cough, shortness of breath, heart palpitations, and falls. He has occasional headaches and dizziness with position changes. Denies severe headaches, confusion, seizures, double vision, and blurred vision, nausea and vomiting. He denies fevers, chills, recent infections, weight loss, and night sweats. No reports of GI problems such diarrhea, and constipation. He has no reports of blood in stools, dysuria and hematuria. No depression or anxiety, and denies suicidal ideations, homicidal ideations, or auditory hallucinations. He is taking all medications as prescribed. He denies pain today.   Past Surgical History:  Procedure Laterality Date  . BACK SURGERY      Family History  Problem Relation Age of Onset  . Diabetes Mellitus II Mother   . Diabetes Mellitus II Father     Social History    Socioeconomic History  . Marital status: Married    Spouse name: Not on file  . Number of children: Not on file  . Years of education: Not on file  . Highest education level: Not on file  Occupational History  . Not on file  Tobacco Use  . Smoking status: Never Smoker  . Smokeless tobacco: Never Used  Substance and Sexual Activity  . Alcohol use: Not Currently  . Drug use: Never  . Sexual activity: Yes  Other Topics Concern  . Not on file  Social History Narrative  . Not on file   Social Determinants of Health   Financial Resource Strain:   . Difficulty of Paying Living Expenses:   Food Insecurity:   . Worried About Programme researcher, broadcasting/film/video in the Last Year:   . Barista in the Last Year:   Transportation Needs:   . Freight forwarder (Medical):   Marland Kitchen Lack of Transportation (Non-Medical):   Physical Activity:   . Days of Exercise per Week:   . Minutes of Exercise per Session:   Stress:   . Feeling of Stress :   Social Connections:   . Frequency of Communication with Friends and Family:   . Frequency of Social Gatherings with Friends and Family:   . Attends Religious Services:   . Active Member of Clubs or Organizations:   . Attends Banker Meetings:   Marland Kitchen Marital Status:   Intimate Partner Violence:   . Fear of Current or Ex-Partner:   . Emotionally Abused:   .  Physically Abused:   . Sexually Abused:     Outpatient Medications Prior to Visit  Medication Sig Dispense Refill  . amLODipine (NORVASC) 10 MG tablet Take 1 tablet (10 mg total) by mouth daily. 90 tablet 3  . aspirin EC 81 MG tablet Take 81 mg by mouth daily.    . fluticasone (FLONASE) 50 MCG/ACT nasal spray Place 2 sprays into both nostrils daily. 16 g 6  . glipiZIDE (GLUCOTROL) 10 MG tablet TAKE 1 TABLET BY MOUTH TWICE DAILY BEFORE A MEAL 60 tablet 0  . hydrochlorothiazide (HYDRODIURIL) 25 MG tablet Take 1 tablet by mouth once daily 90 tablet 0  . ibuprofen (ADVIL,MOTRIN) 800 MG  tablet Take 1 tablet (800 mg total) by mouth every 8 (eight) hours as needed. 30 tablet 3  . levocetirizine (XYZAL) 5 MG tablet TAKE 1 TABLET BY MOUTH ONCE DAILY IN THE EVENING 30 tablet 0  . lisinopril (ZESTRIL) 20 MG tablet Take 1 tablet by mouth once daily 90 tablet 0  . metFORMIN (GLUCOPHAGE) 1000 MG tablet Take 1 tablet (1,000 mg total) by mouth 2 (two) times daily with a meal. 180 tablet 3   No facility-administered medications prior to visit.    No Known Allergies  ROS Review of Systems  Constitutional: Negative.   HENT: Negative.   Eyes: Negative.   Respiratory: Negative.   Cardiovascular: Negative.   Gastrointestinal: Negative.   Endocrine: Negative.   Genitourinary: Negative.   Musculoskeletal: Positive for arthralgias (generalized joint pain).       Bilateral leg cramps  Skin: Negative.   Allergic/Immunologic: Negative.   Neurological: Positive for dizziness (occasional ) and headaches (occasional ).  Hematological: Negative.   Psychiatric/Behavioral: Negative.       Objective:    Physical Exam  Constitutional: He is oriented to person, place, and time. He appears well-developed and well-nourished.  HENT:  Head: Normocephalic and atraumatic.  Eyes: Conjunctivae are normal.  Cardiovascular: Normal rate, regular rhythm, normal heart sounds and intact distal pulses.  Pulmonary/Chest: Effort normal and breath sounds normal.  Abdominal: Soft. Bowel sounds are normal.  Musculoskeletal:        General: Normal range of motion.     Cervical back: Normal range of motion and neck supple.  Neurological: He is alert and oriented to person, place, and time. He has normal reflexes.  Skin: Skin is warm and dry.  Psychiatric: He has a normal mood and affect. His behavior is normal. Judgment and thought content normal.  Nursing note and vitals reviewed.   BP 120/73   Pulse 94   Temp 98.3 F (36.8 C) (Oral)   Ht 6\' 7"  (2.007 m)   Wt 253 lb 6.4 oz (114.9 kg)   SpO2 100%    BMI 28.55 kg/m  Wt Readings from Last 3 Encounters:  06/01/19 253 lb 6.4 oz (114.9 kg)  09/19/18 248 lb (112.5 kg)  08/04/18 250 lb 3.2 oz (113.5 kg)     Health Maintenance Due  Topic Date Due  . PNEUMOCOCCAL POLYSACCHARIDE VACCINE AGE 77-64 HIGH RISK  Never done  . FOOT EXAM  09/24/2018  . OPHTHALMOLOGY EXAM  09/24/2018  . INFLUENZA VACCINE  10/07/2018    There are no preventive care reminders to display for this patient.  No results found for: TSH Lab Results  Component Value Date   WBC 6.4 01/08/2018   HGB 13.2 01/08/2018   HCT 41.3 01/08/2018   MCV 81.3 01/08/2018   PLT 179 01/08/2018   Lab Results  Component Value Date   NA 140 01/08/2018   K 4.0 01/08/2018   CO2 27 01/08/2018   GLUCOSE 169 (H) 01/08/2018   BUN 9 01/08/2018   CREATININE 0.77 01/08/2018   BILITOT 1.6 (H) 01/08/2018   ALKPHOS 48 01/08/2018   AST 16 01/08/2018   ALT 14 01/08/2018   PROT 6.4 (L) 01/08/2018   ALBUMIN 4.2 01/08/2018   CALCIUM 9.2 01/08/2018   ANIONGAP 8 01/08/2018   No results found for: CHOL No results found for: HDL No results found for: LDLCALC No results found for: TRIG No results found for: [facility] Lab Results  Component Value Date   HGBA1C 11.6 (A) 06/01/2019      Assessment & Plan:   1. Type 2 diabetes mellitus with hyperglycemia, without long-term current use of insulin (HCC) He will continue medication as prescribed, to decrease foods/beverages high in sugars and carbs and follow Heart Healthy or DASH diet. Increase physical activity to at least 30 minutes cardio exercise daily.  - POCT urinalysis dipstick - POCT glucose (manual entry) - POCT glycosylated hemoglobin (Hb A1C) - Vitamin B12 - Vitamin D, 25-hydroxy - CBC with Differential - Comprehensive metabolic panel; Future - Magnesium - Lipid Panel  2. Hemoglobin A1C greater than 9%, indicating poor diabetic control Worsened. Hgb A1c is increased at 11.6 today, from 8.3 on 12/20/2018. Monitor.   3.  Hyperglycemia  4. Essential hypertension The current medical regimen is effective; blood pressure is stable at 120/73 today; continue present plan and medications as prescribed. He will continue to take medications as prescribed, to decrease high sodium intake, excessive alcohol intake, increase potassium intake, smoking cessation, and increase physical activity of at least 30 minutes of cardio activity daily. He will continue to follow Heart Healthy or DASH diet.  5. Abnormal urinalysis Results are pending.  - Urine Culture  6. Follow up He will follow up in 3 months.   No orders of the defined types were placed in this encounter.   Orders Placed This Encounter  Procedures  . Urine Culture  . Vitamin B12  . Vitamin D, 25-hydroxy  . CBC with Differential  . Comprehensive metabolic panel  . Magnesium  . Lipid Panel  . POCT urinalysis dipstick  . POCT glucose (manual entry)  . POCT glycosylated hemoglobin (Hb A1C)    Referral Orders  No referral(s) requested today    Raliegh Ip,  MSN, FNP-BC [facility] [address] [place], [place] [address] [phone] fax   Problem List Items Addressed This Visit      Cardiovascular and Mediastinum   Essential hypertension     Endocrine   Type 2 diabetes mellitus with hyperglycemia (HCC) - Primary   Relevant Orders   POCT urinalysis dipstick (Completed)   POCT glucose (manual entry) (Completed)   POCT glycosylated hemoglobin (Hb A1C) (Completed)   Vitamin B12   Vitamin D, 25-hydroxy   CBC with Differential   Comprehensive metabolic panel   Magnesium   Lipid Panel    Other Visit Diagnoses    Hemoglobin A1C greater than 9%, indicating poor diabetic control       Hyperglycemia       Abnormal urinalysis       Relevant Orders   Urine Culture   Follow up          No orders of the defined types were placed in this  encounter.   Follow-up: Return in about 3  months (around 09/01/2019).    Kallie Locks, FNP

## 2019-06-02 LAB — CBC WITH DIFFERENTIAL/PLATELET
Basophils Absolute: 0 10*3/uL (ref 0.0–0.2)
Basos: 0 %
EOS (ABSOLUTE): 0.1 10*3/uL (ref 0.0–0.4)
Eos: 2 %
Hematocrit: 47.1 % (ref 37.5–51.0)
Hemoglobin: 15 g/dL (ref 13.0–17.7)
Immature Grans (Abs): 0 10*3/uL (ref 0.0–0.1)
Immature Granulocytes: 0 %
Lymphocytes Absolute: 1.7 10*3/uL (ref 0.7–3.1)
Lymphs: 36 %
MCH: 26.7 pg (ref 26.6–33.0)
MCHC: 31.8 g/dL (ref 31.5–35.7)
MCV: 84 fL (ref 79–97)
Monocytes Absolute: 0.3 10*3/uL (ref 0.1–0.9)
Monocytes: 7 %
Neutrophils Absolute: 2.7 10*3/uL (ref 1.4–7.0)
Neutrophils: 55 %
Platelets: 179 10*3/uL (ref 150–450)
RBC: 5.61 x10E6/uL (ref 4.14–5.80)
RDW: 12.1 % (ref 11.6–15.4)
WBC: 4.9 10*3/uL (ref 3.4–10.8)

## 2019-06-02 LAB — LIPID PANEL
Chol/HDL Ratio: 5.5 ratio — ABNORMAL HIGH (ref 0.0–5.0)
Cholesterol, Total: 226 mg/dL — ABNORMAL HIGH (ref 100–199)
HDL: 41 mg/dL (ref >39)
LDL Chol Calc (NIH): 150 mg/dL — ABNORMAL HIGH (ref 0–99)
Triglycerides: 193 mg/dL — ABNORMAL HIGH (ref 0–149)
VLDL Cholesterol Cal: 35 mg/dL (ref 5–40)

## 2019-06-02 LAB — VITAMIN B12: Vitamin B-12: 614 pg/mL (ref 232–1245)

## 2019-06-02 LAB — VITAMIN D 25 HYDROXY (VIT D DEFICIENCY, FRACTURES): Vit D, 25-Hydroxy: 19.6 ng/mL — ABNORMAL LOW (ref 30.0–100.0)

## 2019-06-02 LAB — MAGNESIUM: Magnesium: 1.7 mg/dL (ref 1.6–2.3)

## 2019-06-03 ENCOUNTER — Encounter: Payer: Self-pay | Admitting: Family Medicine

## 2019-06-03 ENCOUNTER — Other Ambulatory Visit: Payer: Self-pay | Admitting: Family Medicine

## 2019-06-03 DIAGNOSIS — R7309 Other abnormal glucose: Secondary | ICD-10-CM

## 2019-06-03 DIAGNOSIS — E785 Hyperlipidemia, unspecified: Secondary | ICD-10-CM

## 2019-06-03 DIAGNOSIS — E1165 Type 2 diabetes mellitus with hyperglycemia: Secondary | ICD-10-CM

## 2019-06-03 DIAGNOSIS — E559 Vitamin D deficiency, unspecified: Secondary | ICD-10-CM

## 2019-06-03 LAB — URINE CULTURE: Organism ID, Bacteria: NO GROWTH

## 2019-06-03 MED ORDER — ATORVASTATIN CALCIUM 10 MG PO TABS: 10.0000 mg | ORAL_TABLET | Freq: Every day | ORAL | 6 refills | Status: DC

## 2019-06-03 MED ORDER — VITAMIN D (ERGOCALCIFEROL) 1.25 MG (50000 UNIT) PO CAPS: 50000.0000 [IU] | ORAL_CAPSULE | ORAL | 3 refills | Status: DC

## 2019-07-08 ENCOUNTER — Other Ambulatory Visit: Payer: Self-pay | Admitting: Family Medicine

## 2019-07-08 DIAGNOSIS — G43909 Migraine, unspecified, not intractable, without status migrainosus: Secondary | ICD-10-CM

## 2019-07-22 IMAGING — RF DG FLUORO GUIDE LUMBAR PUNCTURE
1 series · 1 of 1 positions shown · non-contrast
Comparison: none

CLINICAL DATA: Fever and headache, possible meningitis.

[Series 1: cp_standard · 0.18mm/px · 1 of 1 slices shown]
[im 1/1]
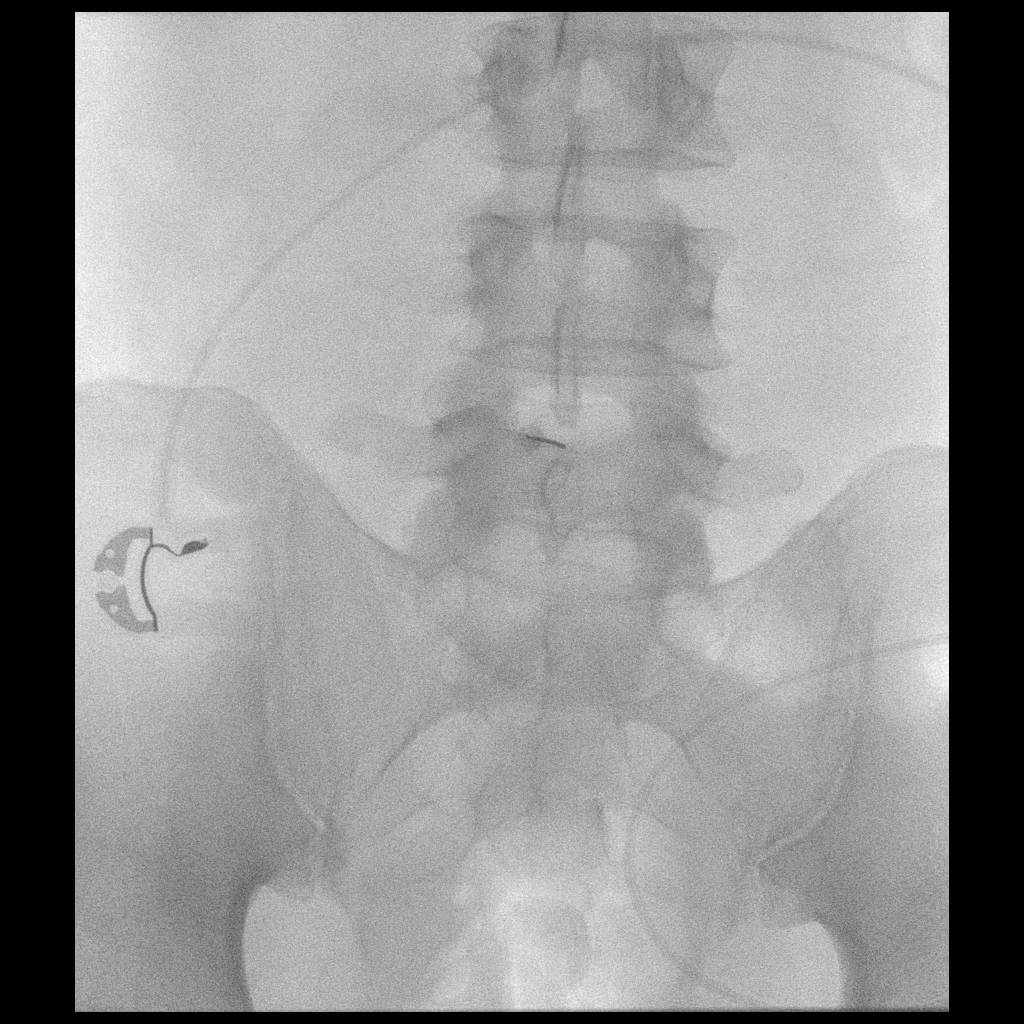

[1 of 1 positions shown; findings below may reference images not displayed]

EXAM:
DIAGNOSTIC LUMBAR PUNCTURE UNDER FLUOROSCOPIC GUIDANCE

FLUOROSCOPY TIME:  Fluoroscopy Time:  0 minutes, 30 seconds

Radiation Exposure Index (if provided by the fluoroscopic device):
5.2 mGy

Number of Acquired Spot Images: 0

PROCEDURE:
I discussed the risks (including hemorrhage, infection, headache,
and nerve damage, among others), benefits, and alternatives to
fluoroscopically guided lumbar puncture with the patient. We
specifically discussed the high technical likelihood of success of
the procedure. The patient understood and elected to undergo the
procedure.

Standard time-out was employed. Following sterile skin prep and
local anesthetic administration consisting of 1 percent lidocaine, a
22 gauge spinal needle was advanced without difficulty into the
thecal sac at the at the L4-5 level. Clear CSF was returned. Opening
pressure was 12 cm of water with the patient in the left lateral
decubitus position.

13 cc of clear CSF was collected. The needle was subsequently
removed and the skin cleansed and bandaged. No immediate
complications were observed.
IMPRESSION: Technically successful fluoroscopically guided lumbar puncture
yielding 13 cc of clear CSF. Opening pressure was 12 cm water.

## 2019-07-22 IMAGING — CT CT HEAD W/O CM
4 series · 17 of 47 positions shown, 19 images · non-contrast
Comparison: None.

CLINICAL DATA: 42 y/o M; headache, fever, lightheadedness, nausea,
vomiting, and body aches since [REDACTED].

EXAM:
CT HEAD WITHOUT CONTRAST
TECHNIQUE: Contiguous axial images were obtained from the base of the skull
through the vertex without intravenous contrast.

[Series 3: head without · axial · non-contrast · 0.43mm/px · z∈[-75,+45]mm · 7 of 34 slices shown, 9 images]
[im 5/34  brain]
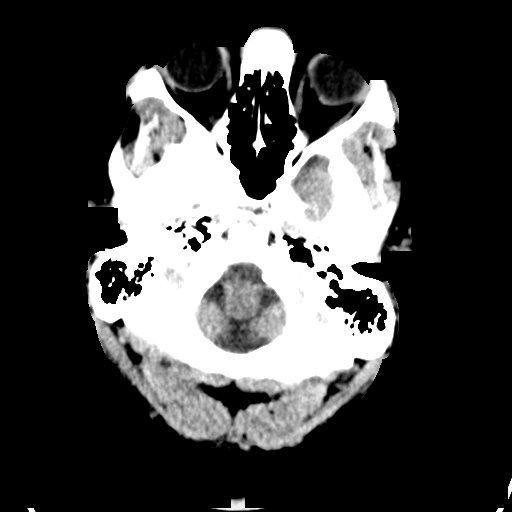
[im 5/34  bone]
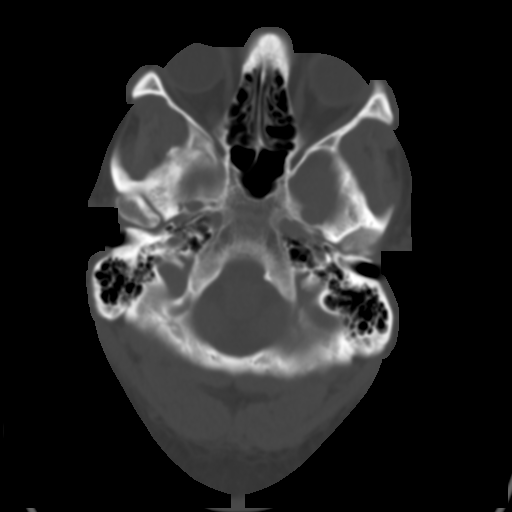
[im 9/34  brain]
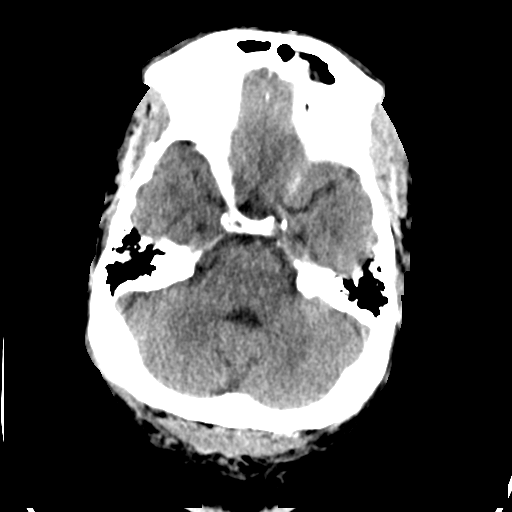
[im 13/34  brain]
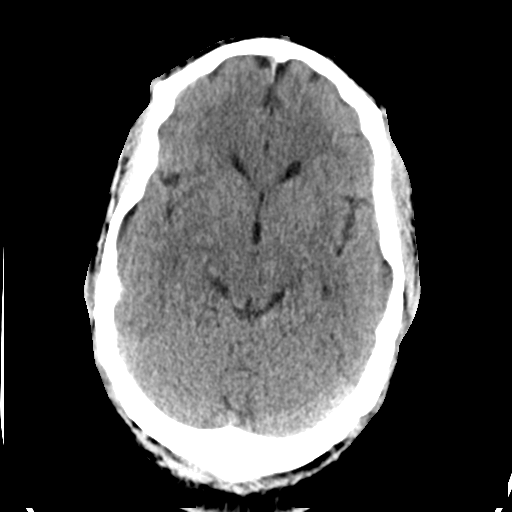
[im 17/34  brain]
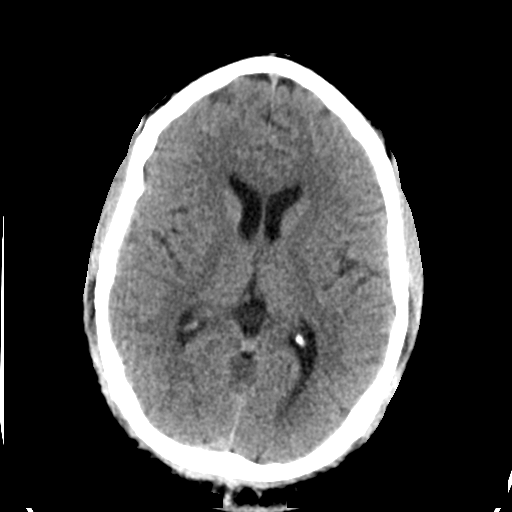
[im 21/34  brain]
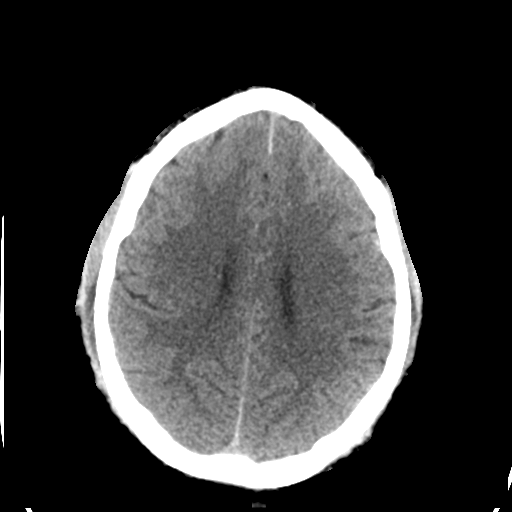
[im 21/34  bone]
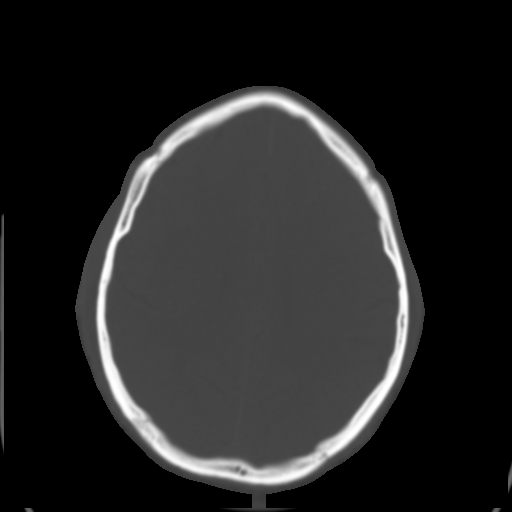
[im 25/34  brain]
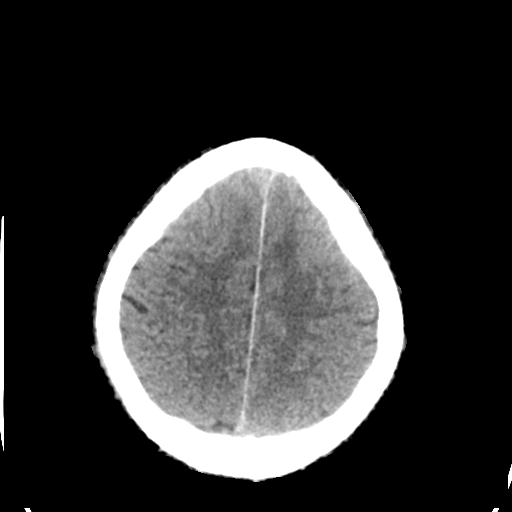
[im 29/34  brain]
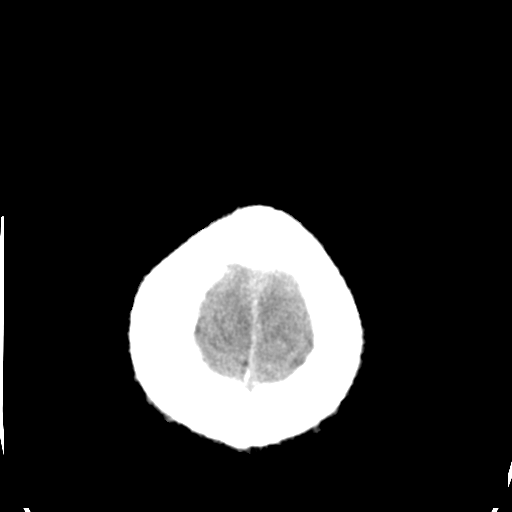

[Series 4: head bone · axial · 0.43mm/px · z∈[-79,-21]mm · 4 of 84 slices shown]
[im 9/84  bone]
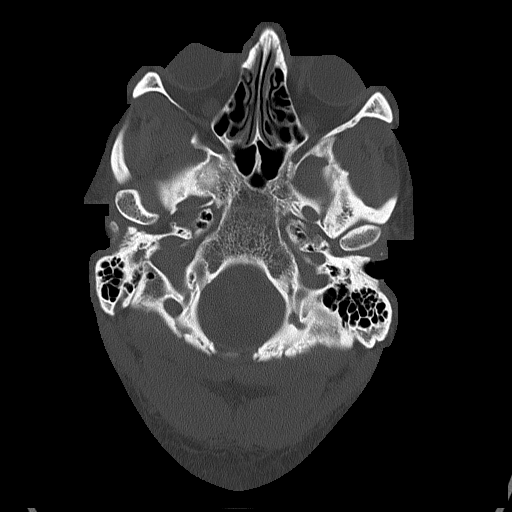
[im 17/84  bone]
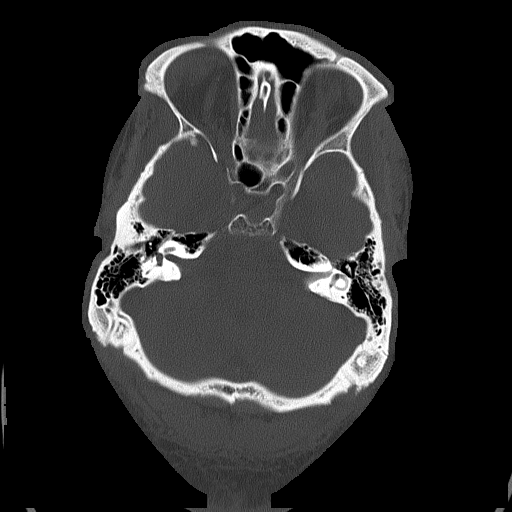
[im 25/84  bone]
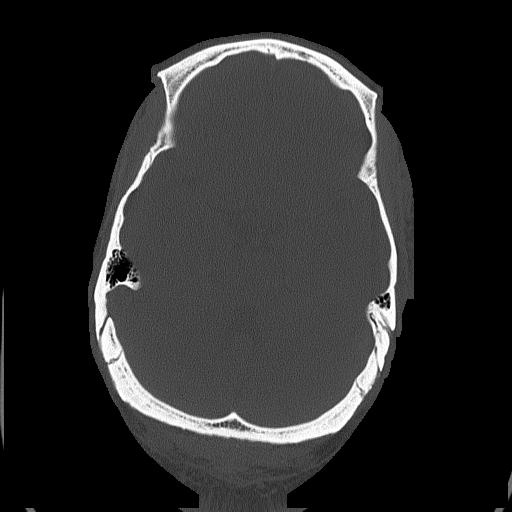
[im 38/84  bone]
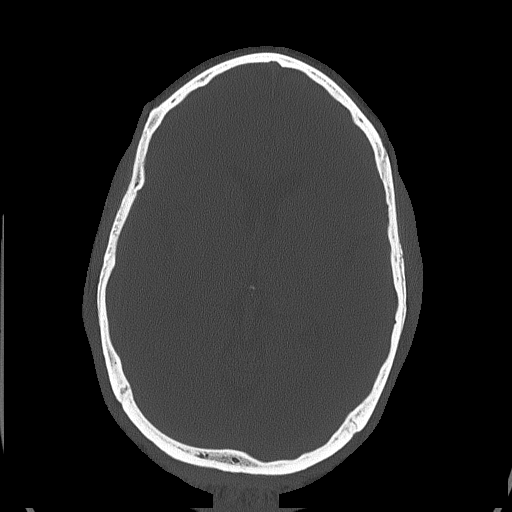

[Series 5: head without cor · coronal · non-contrast · 0.33mm/px · 3 of 67 slices shown]
[im 23/67  brain]
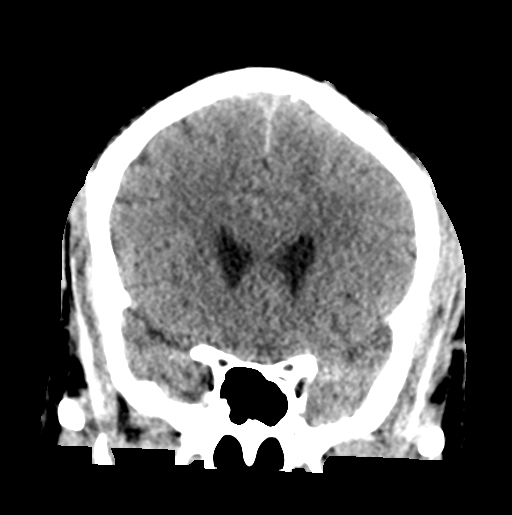
[im 30/67  brain]
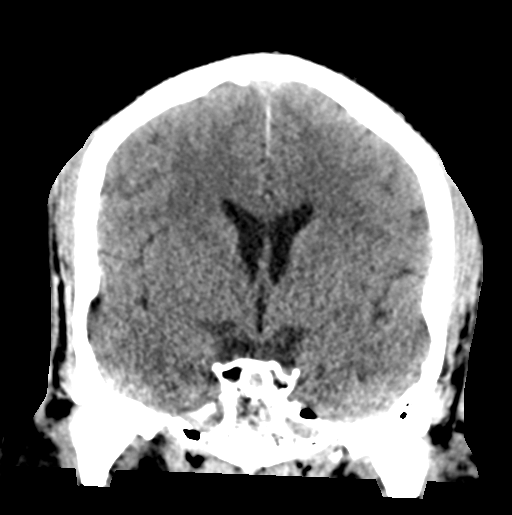
[im 37/67  brain]
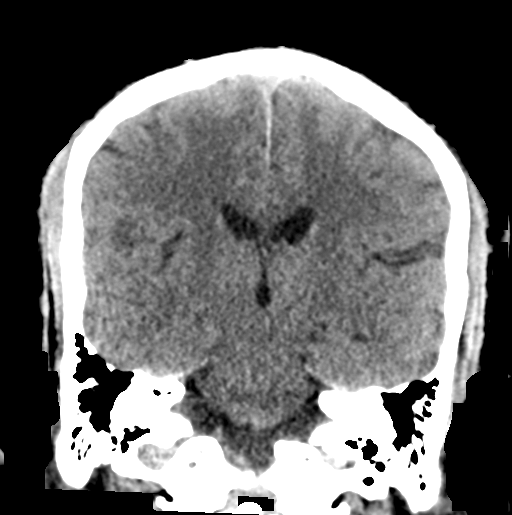

[Series 6: head without sag · sagittal · non-contrast · 0.33mm/px · 3 of 55 slices shown]
[im 19/55  brain]
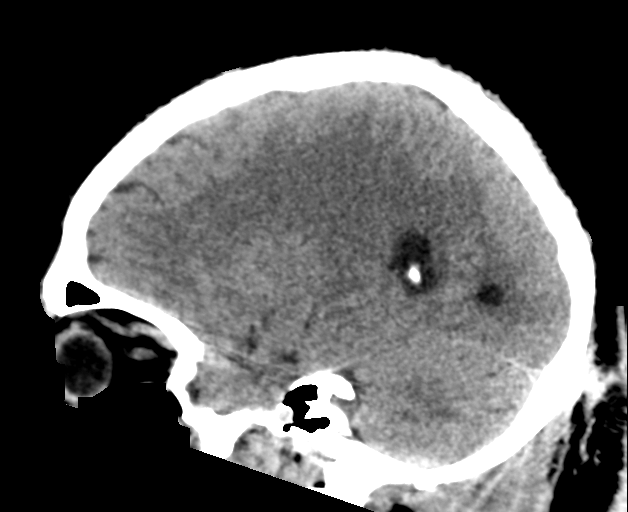
[im 28/55  brain]
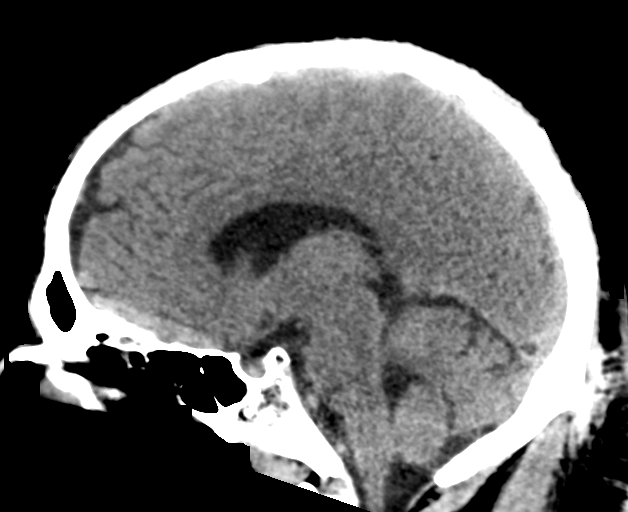
[im 37/55  brain]
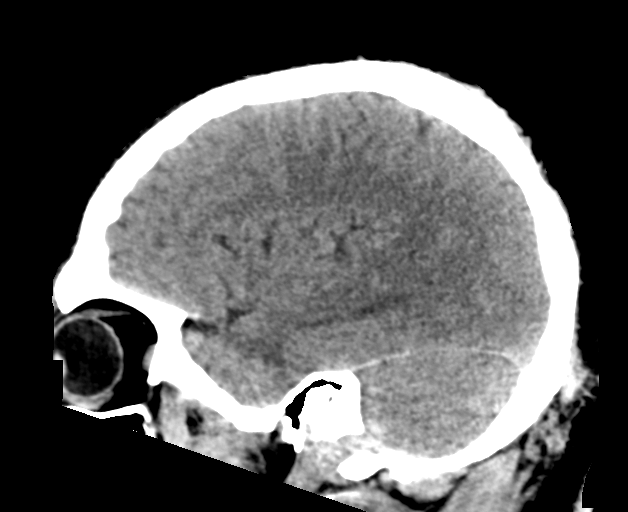

[17 of 47 positions shown; findings below may reference images not displayed]

FINDINGS: Brain: No evidence of acute infarction, hemorrhage, hydrocephalus,
extra-axial collection or mass lesion/mass effect.

Vascular: No hyperdense vessel or unexpected calcification.

Skull: Normal. Negative for fracture or focal lesion.

Sinuses/Orbits: No acute finding.

Other: None.
IMPRESSION: Negative CT of the head. No acute intracranial abnormality
identified.

By: Grobari Jug Ivanovic M.D.

## 2019-07-29 ENCOUNTER — Other Ambulatory Visit: Payer: Self-pay | Admitting: Family Medicine

## 2019-07-29 DIAGNOSIS — G43909 Migraine, unspecified, not intractable, without status migrainosus: Secondary | ICD-10-CM

## 2019-07-29 DIAGNOSIS — I10 Essential (primary) hypertension: Secondary | ICD-10-CM

## 2019-08-07 DIAGNOSIS — R6882 Decreased libido: Secondary | ICD-10-CM

## 2019-08-07 HISTORY — DX: Decreased libido: R68.82

## 2019-08-24 ENCOUNTER — Other Ambulatory Visit: Payer: Self-pay | Admitting: Family Medicine

## 2019-08-24 DIAGNOSIS — G43909 Migraine, unspecified, not intractable, without status migrainosus: Secondary | ICD-10-CM

## 2019-09-03 ENCOUNTER — Other Ambulatory Visit: Payer: Self-pay

## 2019-09-03 ENCOUNTER — Ambulatory Visit (INDEPENDENT_AMBULATORY_CARE_PROVIDER_SITE_OTHER): Payer: Self-pay | Admitting: Family Medicine

## 2019-09-03 VITALS — BP 146/75 | HR 85 | Temp 97.8°F | Ht 79.0 in | Wt 243.0 lb

## 2019-09-03 DIAGNOSIS — I10 Essential (primary) hypertension: Secondary | ICD-10-CM

## 2019-09-03 DIAGNOSIS — E1165 Type 2 diabetes mellitus with hyperglycemia: Secondary | ICD-10-CM

## 2019-09-03 LAB — POCT URINALYSIS DIPSTICK
Bilirubin, UA: NEGATIVE
Blood, UA: NEGATIVE
Glucose, UA: POSITIVE — AB
Leukocytes, UA: NEGATIVE
Nitrite, UA: NEGATIVE
Protein, UA: NEGATIVE
Spec Grav, UA: 1.02 (ref 1.010–1.025)
Urobilinogen, UA: 0.2 E.U./dL
pH, UA: 5 (ref 5.0–8.0)

## 2019-09-03 LAB — GLUCOSE, POCT (MANUAL RESULT ENTRY): POC Glucose: 352 mg/dl — AB (ref 70–99)

## 2019-09-03 LAB — POCT GLYCOSYLATED HEMOGLOBIN (HGB A1C)
HbA1c POC (<> result, manual entry): 13.7 % (ref 4.0–5.6)
HbA1c, POC (controlled diabetic range): 13.7 % — AB (ref 0.0–7.0)
HbA1c, POC (prediabetic range): 13.7 % — AB (ref 5.7–6.4)
Hemoglobin A1C: 13.7 % — AB (ref 4.0–5.6)

## 2019-09-05 ENCOUNTER — Ambulatory Visit (INDEPENDENT_AMBULATORY_CARE_PROVIDER_SITE_OTHER): Payer: Self-pay | Admitting: Family Medicine

## 2019-09-05 ENCOUNTER — Other Ambulatory Visit: Payer: Self-pay

## 2019-09-05 ENCOUNTER — Encounter: Payer: Self-pay | Admitting: Family Medicine

## 2019-09-05 VITALS — BP 146/83 | HR 83 | Temp 97.8°F | Ht 79.0 in | Wt 246.0 lb

## 2019-09-05 DIAGNOSIS — E559 Vitamin D deficiency, unspecified: Secondary | ICD-10-CM

## 2019-09-05 DIAGNOSIS — E1165 Type 2 diabetes mellitus with hyperglycemia: Secondary | ICD-10-CM

## 2019-09-05 DIAGNOSIS — E1169 Type 2 diabetes mellitus with other specified complication: Secondary | ICD-10-CM

## 2019-09-05 DIAGNOSIS — R739 Hyperglycemia, unspecified: Secondary | ICD-10-CM

## 2019-09-05 DIAGNOSIS — E785 Hyperlipidemia, unspecified: Secondary | ICD-10-CM

## 2019-09-05 DIAGNOSIS — I10 Essential (primary) hypertension: Secondary | ICD-10-CM

## 2019-09-05 DIAGNOSIS — R6882 Decreased libido: Secondary | ICD-10-CM

## 2019-09-05 DIAGNOSIS — N521 Erectile dysfunction due to diseases classified elsewhere: Secondary | ICD-10-CM

## 2019-09-05 DIAGNOSIS — Z09 Encounter for follow-up examination after completed treatment for conditions other than malignant neoplasm: Secondary | ICD-10-CM

## 2019-09-05 DIAGNOSIS — G43909 Migraine, unspecified, not intractable, without status migrainosus: Secondary | ICD-10-CM

## 2019-09-05 DIAGNOSIS — R7309 Other abnormal glucose: Secondary | ICD-10-CM

## 2019-09-05 LAB — GLUCOSE, POCT (MANUAL RESULT ENTRY): POC Glucose: 285 mg/dl — AB (ref 70–99)

## 2019-09-05 MED ORDER — SILDENAFIL CITRATE 100 MG PO TABS: 50.0000 mg | ORAL_TABLET | Freq: Every day | ORAL | 11 refills | Status: DC | PRN

## 2019-09-05 MED ORDER — IBUPROFEN 800 MG PO TABS: 800.0000 mg | ORAL_TABLET | Freq: Three times a day (TID) | ORAL | 11 refills | Status: DC | PRN

## 2019-09-05 MED ORDER — HYDROCHLOROTHIAZIDE 25 MG PO TABS: 25.0000 mg | ORAL_TABLET | Freq: Every day | ORAL | 3 refills | Status: DC

## 2019-09-05 MED ORDER — LANTUS SOLOSTAR 100 UNIT/ML ~~LOC~~ SOPN
50.0000 [IU] | PEN_INJECTOR | Freq: Every day | SUBCUTANEOUS | 99 refills | Status: DC
Start: 2019-09-05 — End: 2021-01-28

## 2019-09-05 MED ORDER — ATORVASTATIN CALCIUM 10 MG PO TABS: 10.0000 mg | ORAL_TABLET | Freq: Every day | ORAL | 3 refills | Status: DC

## 2019-09-05 MED ORDER — LISINOPRIL 20 MG PO TABS: 20.0000 mg | ORAL_TABLET | Freq: Every day | ORAL | 3 refills | Status: DC

## 2019-09-05 MED ORDER — VITAMIN D (ERGOCALCIFEROL) 1.25 MG (50000 UNIT) PO CAPS: 50000.0000 [IU] | ORAL_CAPSULE | ORAL | 2 refills | Status: DC

## 2019-09-05 MED ORDER — LEVOCETIRIZINE DIHYDROCHLORIDE 5 MG PO TABS: 5.0000 mg | ORAL_TABLET | Freq: Every evening | ORAL | 11 refills | Status: DC

## 2019-09-05 MED ORDER — METFORMIN HCL 1000 MG PO TABS: 1000.0000 mg | ORAL_TABLET | Freq: Two times a day (BID) | ORAL | 3 refills | Status: DC

## 2019-09-05 MED ORDER — GLIPIZIDE 10 MG PO TABS: ORAL_TABLET | ORAL | 3 refills | Status: DC

## 2019-09-05 MED ORDER — AMLODIPINE BESYLATE 10 MG PO TABS: 10.0000 mg | ORAL_TABLET | Freq: Every day | ORAL | 3 refills | Status: DC

## 2019-09-05 NOTE — Progress Notes (Signed)
[facility]   Established Patient Office Visit  Subjective:  Patient ID: Noah Bates, male    DOB: 07-31-1975  Age: 44 y.o. MRN: [id number]  CC:  Chief Complaint  Patient presents with  . Follow-up    3 month follow up; no concerns    HPI Noah Bates is a 44 year old male who presents for Follow Up today.    Patient Active Problem List   Diagnosis Date Noted  . Hemoglobin A1C greater than 9%, indicating poor diabetic control 06/01/2019  . Hyperglycemia 06/01/2019  . SIRS (systemic inflammatory response syndrome) (HCC) 08/31/2017  . Essential hypertension 08/31/2017  . Type 2 diabetes mellitus with hyperglycemia (HCC) 08/31/2017    Past Medical History:  Diagnosis Date  . Decreased libido 08/2019  . Diabetes mellitus without complication (HCC)   . Hyperlipidemia 05/2019  . Hypertension   . Vitamin D deficiency 05/2019   Current Status: Since his last office visit, he is doing well with no complaints. He has not been taking his medications regularly as prescribed. He states that he does have blurry vision. He denies fatigue, frequent urination, blurred vision, excessive hunger, excessive thirst, weight gain, weight loss, and poor wound healing. He continues to check his feet regularly. He denies visual changes, chest pain, cough, shortness of breath, heart palpitations, and falls. He has occasional headaches and dizziness with position changes. Denies severe headaches, confusion, seizures, double vision, nausea and vomiting. He denies fevers, chills, recent infections, weight loss, and night sweats. Denies GI problems such as diarrhea, and constipation. He has no reports of blood in stools, dysuria and hematuria. No depression or anxiety reported today. He states that he is taking all medications as prescribed. He denies pain today.   Past Surgical History:  Procedure Laterality Date  . BACK SURGERY       Family History  Problem Relation Age of Onset  . Diabetes Mellitus II Mother   . Diabetes Mellitus II Father     Social History   Socioeconomic History  . Marital status: Married    Spouse name: Not on file  . Number of children: Not on file  . Years of education: Not on file  . Highest education level: Not on file  Occupational History  . Not on file  Tobacco Use  . Smoking status: Never Smoker  . Smokeless tobacco: Never Used  Vaping Use  . Vaping Use: Never used  Substance and Sexual Activity  . Alcohol use: Not Currently  . Drug use: Never  . Sexual activity: Yes  Other Topics Concern  . Not on file  Social History Narrative  . Not on file   Social Determinants of Health   Financial Resource Strain:   . Difficulty of Paying Living Expenses:   Food Insecurity:   . Worried About Programme researcher, broadcasting/film/video in the Last Year:   . Barista in the Last Year:   Transportation Needs:   . Freight forwarder (Medical):   Marland Kitchen Lack of Transportation (Non-Medical):   Physical Activity:   . Days of Exercise per Week:   . Minutes of Exercise per Session:   Stress:   . Feeling of Stress :   Social Connections:   . Frequency of Communication with Friends and Family:   . Frequency of Social Gatherings with Friends and Family:   . Attends Religious Services:   . Active Member of Clubs or Organizations:   .  Attends Banker Meetings:   Marland Kitchen Marital Status:   Intimate Partner Violence:   . Fear of Current or Ex-Partner:   . Emotionally Abused:   Marland Kitchen Physically Abused:   . Sexually Abused:     Outpatient Medications Prior to Visit  Medication Sig Dispense Refill  . aspirin EC 81 MG tablet Take 81 mg by mouth daily.    Marland Kitchen amLODipine (NORVASC) 10 MG tablet Take 1 tablet (10 mg total) by mouth daily. 90 tablet 3  . atorvastatin (LIPITOR) 10 MG tablet Take 1 tablet (10 mg total) by mouth daily. 30 tablet 6  . glipiZIDE (GLUCOTROL) 10 MG tablet TAKE 1 TABLET BY  MOUTH TWICE DAILY BEFORE A MEAL 60 tablet 0  . hydrochlorothiazide (HYDRODIURIL) 25 MG tablet Take 1 tablet by mouth once daily 90 tablet 0  . ibuprofen (ADVIL,MOTRIN) 800 MG tablet Take 1 tablet (800 mg total) by mouth every 8 (eight) hours as needed. 30 tablet 3  . levocetirizine (XYZAL) 5 MG tablet TAKE 1 TABLET BY MOUTH ONCE DAILY IN THE EVENING 30 tablet 3  . lisinopril (ZESTRIL) 20 MG tablet Take 1 tablet by mouth once daily 90 tablet 0  . metFORMIN (GLUCOPHAGE) 1000 MG tablet Take 1 tablet (1,000 mg total) by mouth 2 (two) times daily with a meal. 180 tablet 3  . Vitamin D, Ergocalciferol, (DRISDOL) 1.25 MG (50000 UNIT) CAPS capsule Take 1 capsule (50,000 Units total) by mouth every 7 (seven) days. 5 capsule 3  . fluticasone (FLONASE) 50 MCG/ACT nasal spray Place 2 sprays into both nostrils daily. (Patient not taking: Reported on 09/05/2019) 16 g 6   No facility-administered medications prior to visit.    No Known Allergies  ROS Review of Systems  Constitutional: Negative.   HENT: Negative.   Eyes: Positive for visual disturbance (blurry vision).  Respiratory: Negative.   Cardiovascular: Negative.   Gastrointestinal: Negative.   Endocrine: Negative.   Genitourinary: Negative.   Musculoskeletal: Positive for arthralgias (generalized joint pian).  Skin: Negative.   Allergic/Immunologic: Negative.   Neurological: Positive for dizziness (occasional ) and headaches (occasional ).  Hematological: Negative.   Psychiatric/Behavioral: Negative.    Objective:    Physical Exam Vitals and nursing note reviewed.  Constitutional:      Appearance: Normal appearance. He is normal weight.  HENT:     Head: Normocephalic and atraumatic.     Nose: Nose normal.     Mouth/Throat:     Mouth: Mucous membranes are moist.     Pharynx: Oropharynx is clear.  Cardiovascular:     Rate and Rhythm: Normal rate and regular rhythm.     Pulses: Normal pulses.     Heart sounds: Normal heart sounds.   Pulmonary:     Effort: Pulmonary effort is normal.     Breath sounds: Normal breath sounds.  Abdominal:     General: Bowel sounds are normal.     Palpations: Abdomen is soft.  Musculoskeletal:        General: Normal range of motion.     Cervical back: Normal range of motion and neck supple.  Skin:    General: Skin is warm and dry.  Neurological:     General: No focal deficit present.     Mental Status: He is alert and oriented to person, place, and time.  Psychiatric:        Mood and Affect: Mood normal.        Behavior: Behavior normal.  Thought Content: Thought content normal.        Judgment: Judgment normal.     BP (!) 156/83   Pulse 79   Temp 97.8 F (36.6 C)   Ht 6\' 7"  (2.007 m)   Wt 246 lb 0.6 oz (111.6 kg)   SpO2 100%   BMI 27.72 kg/m  Wt Readings from Last 3 Encounters:  09/05/19 246 lb 0.6 oz (111.6 kg)  09/03/19 243 lb (110.2 kg)  06/01/19 253 lb 6.4 oz (114.9 kg)     Health Maintenance Due  Topic Date Due  . Hepatitis C Screening  Never done  . PNEUMOCOCCAL POLYSACCHARIDE VACCINE AGE 84-64 HIGH RISK  Never done  . COVID-19 Vaccine (1) Never done  . FOOT EXAM  09/24/2018  . OPHTHALMOLOGY EXAM  09/24/2018    There are no preventive care reminders to display for this patient.  No results found for: TSH Lab Results  Component Value Date   WBC 4.9 06/01/2019   HGB 15.0 06/01/2019   HCT 47.1 06/01/2019   MCV 84 06/01/2019   PLT 179 06/01/2019   Lab Results  Component Value Date   NA 140 01/08/2018   K 4.0 01/08/2018   CO2 27 01/08/2018   GLUCOSE 169 (H) 01/08/2018   BUN 9 01/08/2018   CREATININE 0.77 01/08/2018   BILITOT 1.6 (H) 01/08/2018   ALKPHOS 48 01/08/2018   AST 16 01/08/2018   ALT 14 01/08/2018   PROT 6.4 (L) 01/08/2018   ALBUMIN 4.2 01/08/2018   CALCIUM 9.2 01/08/2018   ANIONGAP 8 01/08/2018   Lab Results  Component Value Date   CHOL 226 (H) 06/01/2019   Lab Results  Component Value Date   HDL 41 06/01/2019   Lab  Results  Component Value Date   LDLCALC 150 (H) 06/01/2019   Lab Results  Component Value Date   TRIG 193 (H) 06/01/2019   Lab Results  Component Value Date   CHOLHDL 5.5 (H) 06/01/2019   Lab Results  Component Value Date   HGBA1C 13.7 (A) 09/03/2019   HGBA1C 13.7 09/03/2019   HGBA1C 13.7 (A) 09/03/2019   HGBA1C 13.7 (A) 09/03/2019    Assessment & Plan:   1. Type 2 diabetes mellitus with hyperglycemia, without long-term current use of insulin (HCC) He will continue medication as prescribed, to decrease foods/beverages high in sugars and carbs and follow Heart Healthy or DASH diet. Increase physical activity to at least 30 minutes cardio exercise daily.  - atorvastatin (LIPITOR) 10 MG tablet; Take 1 tablet (10 mg total) by mouth daily.  Dispense: 90 tablet; Refill: 3 - glipiZIDE (GLUCOTROL) 10 MG tablet; TAKE 1 TABLET BY MOUTH TWICE DAILY BEFORE A MEAL  Dispense: 180 tablet; Refill: 3 - metFORMIN (GLUCOPHAGE) 1000 MG tablet; Take 1 tablet (1,000 mg total) by mouth 2 (two) times daily with a meal.  Dispense: 180 tablet; Refill: 3  2. Hemoglobin A1C greater than 9%, indicating poor diabetic control Worsened. Hgb A1c elevated at 13.7, from 11.6 on 06/01/2019. Monitor.  - atorvastatin (LIPITOR) 10 MG tablet; Take 1 tablet (10 mg total) by mouth daily.  Dispense: 90 tablet; Refill: 3  3. Elevated glucose - glipiZIDE (GLUCOTROL) 10 MG tablet; TAKE 1 TABLET BY MOUTH TWICE DAILY BEFORE A MEAL  Dispense: 180 tablet; Refill: 3 - metFORMIN (GLUCOPHAGE) 1000 MG tablet; Take 1 tablet (1,000 mg total) by mouth 2 (two) times daily with a meal.  Dispense: 180 tablet; Refill: 3  4. Hyperglycemia  5. Erectile dysfunction due  to type 2 diabetes mellitus (HCC) - sildenafil (VIAGRA) 100 MG tablet; Take 0.5-1 tablets (50-100 mg total) by mouth daily as needed for erectile dysfunction.  Dispense: 5 tablet; Refill: 11  6. Essential hypertension The current medical regimen is effective; blood pressure  is stable at 146/83 today; continue present plan and medications as prescribed. He will continue to take medications as prescribed, to decrease high sodium intake, excessive alcohol intake, increase potassium intake, smoking cessation, and increase physical activity of at least 30 minutes of cardio activity daily. He will continue to follow Heart Healthy or DASH diet. - amLoDipine (NORVASC) 10 MG tablet; Take 1 tablet (10 mg total) by mouth daily.  Dispense: 90 tablet; Refill: 3 - hydrochlorothiazide (HYDRODIURIL) 25 MG tablet; Take 1 tablet (25 mg total) by mouth daily.  Dispense: 90 tablet; Refill: 3 - lisinopril (ZESTRIL) 20 MG tablet; Take 1 tablet (20 mg total) by mouth daily.  Dispense: 90 tablet; Refill: 3  7. Hyperlipidemia, unspecified hyperlipidemia type - atorvastatin (LIPITOR) 10 MG tablet; Take 1 tablet (10 mg total) by mouth daily.  Dispense: 90 tablet; Refill: 3  8. Migraine without status migrainosus, not intractable, unspecified migraine type We will initiate Motrin today.  - ibuprofen (ADVIL) 800 MG tablet; Take 1 tablet (800 mg total) by mouth every 8 (eight) hours as needed.  Dispense: 30 tablet; Refill: 11 - levocetirizine (XYZAL) 5 MG tablet; Take 1 tablet (5 mg total) by mouth every evening.  Dispense: 30 tablet; Refill: 11  9. Vitamin D deficiency - Vitamin D, Ergocalciferol, (DRISDOL) 1.25 MG (50000 UNIT) CAPS capsule; Take 1 capsule (50,000 Units total) by mouth every 7 (seven) days.  Dispense: 5 capsule; Refill: 2  10. Decreased libido - Testosterone  11. Follow up He will follow up in 1 and 3 months for diabetes check.   Meds ordered this encounter  Medications  . amLODipine (NORVASC) 10 MG tablet    Sig: Take 1 tablet (10 mg total) by mouth daily.    Dispense:  90 tablet    Refill:  3  . insulin glargine (LANTUS SOLOSTAR) 100 UNIT/ML Solostar Pen    Sig: Inject 50 Units into the skin at bedtime.    Dispense:  5 pen    Refill:  PRN  . atorvastatin (LIPITOR)  10 MG tablet    Sig: Take 1 tablet (10 mg total) by mouth daily.    Dispense:  90 tablet    Refill:  3  . glipiZIDE (GLUCOTROL) 10 MG tablet    Sig: TAKE 1 TABLET BY MOUTH TWICE DAILY BEFORE A MEAL    Dispense:  180 tablet    Refill:  3  . hydrochlorothiazide (HYDRODIURIL) 25 MG tablet    Sig: Take 1 tablet (25 mg total) by mouth daily.    Dispense:  90 tablet    Refill:  3  . ibuprofen (ADVIL) 800 MG tablet    Sig: Take 1 tablet (800 mg total) by mouth every 8 (eight) hours as needed.    Dispense:  30 tablet    Refill:  11  . levocetirizine (XYZAL) 5 MG tablet    Sig: Take 1 tablet (5 mg total) by mouth every evening.    Dispense:  30 tablet    Refill:  11  . lisinopril (ZESTRIL) 20 MG tablet    Sig: Take 1 tablet (20 mg total) by mouth daily.    Dispense:  90 tablet    Refill:  3  . metFORMIN (GLUCOPHAGE) 1000 MG  tablet    Sig: Take 1 tablet (1,000 mg total) by mouth 2 (two) times daily with a meal.    Dispense:  180 tablet    Refill:  3  . Vitamin D, Ergocalciferol, (DRISDOL) 1.25 MG (50000 UNIT) CAPS capsule    Sig: Take 1 capsule (50,000 Units total) by mouth every 7 (seven) days.    Dispense:  5 capsule    Refill:  2  . sildenafil (VIAGRA) 100 MG tablet    Sig: Take 0.5-1 tablets (50-100 mg total) by mouth daily as needed for erectile dysfunction.    Dispense:  5 tablet    Refill:  11    Orders Placed This Encounter  Procedures  . Testosterone    Referral Orders  No referral(s) requested today    Raliegh IpNatalie Angelly Spearing,  MSN, FNP-BC [facility] [address]  [place], [place] [id number] [phone]   Problem List Items Addressed This Visit      Cardiovascular and Mediastinum   Essential hypertension   Relevant Medications   amLODipine (NORVASC) 10 MG tablet   atorvastatin (LIPITOR) 10 MG tablet   hydrochlorothiazide (HYDRODIURIL) 25 MG tablet    lisinopril (ZESTRIL) 20 MG tablet   sildenafil (VIAGRA) 100 MG tablet     Endocrine   Hemoglobin A1C greater than 9%, indicating poor diabetic control   Relevant Medications   insulin glargine (LANTUS SOLOSTAR) 100 UNIT/ML Solostar Pen   atorvastatin (LIPITOR) 10 MG tablet   glipiZIDE (GLUCOTROL) 10 MG tablet   lisinopril (ZESTRIL) 20 MG tablet   metFORMIN (GLUCOPHAGE) 1000 MG tablet   Type 2 diabetes mellitus with hyperglycemia (HCC) - Primary   Relevant Medications   insulin glargine (LANTUS SOLOSTAR) 100 UNIT/ML Solostar Pen   atorvastatin (LIPITOR) 10 MG tablet   glipiZIDE (GLUCOTROL) 10 MG tablet   lisinopril (ZESTRIL) 20 MG tablet   metFORMIN (GLUCOPHAGE) 1000 MG tablet     Other   Hyperglycemia    Other Visit Diagnoses    Elevated glucose       Relevant Medications   glipiZIDE (GLUCOTROL) 10 MG tablet   metFORMIN (GLUCOPHAGE) 1000 MG tablet   Erectile dysfunction due to type 2 diabetes mellitus (HCC)       Relevant Medications   insulin glargine (LANTUS SOLOSTAR) 100 UNIT/ML Solostar Pen   atorvastatin (LIPITOR) 10 MG tablet   glipiZIDE (GLUCOTROL) 10 MG tablet   lisinopril (ZESTRIL) 20 MG tablet   metFORMIN (GLUCOPHAGE) 1000 MG tablet   sildenafil (VIAGRA) 100 MG tablet   Hyperlipidemia, unspecified hyperlipidemia type       Relevant Medications   amLODipine (NORVASC) 10 MG tablet   atorvastatin (LIPITOR) 10 MG tablet   hydrochlorothiazide (HYDRODIURIL) 25 MG tablet   lisinopril (ZESTRIL) 20 MG tablet   sildenafil (VIAGRA) 100 MG tablet   Migraine without status migrainosus, not intractable, unspecified migraine type       Relevant Medications   amLODipine (NORVASC) 10 MG tablet   atorvastatin (LIPITOR) 10 MG tablet   hydrochlorothiazide (HYDRODIURIL) 25 MG tablet   ibuprofen (ADVIL) 800 MG tablet   levocetirizine (XYZAL) 5 MG tablet   lisinopril (ZESTRIL) 20 MG tablet   sildenafil (VIAGRA) 100 MG tablet   Vitamin D deficiency       Relevant Medications    Vitamin D, Ergocalciferol, (DRISDOL) 1.25 MG (50000 UNIT) CAPS capsule   Decreased libido       Relevant Orders   Testosterone  Follow up          Meds ordered this encounter  Medications  . amLODipine (NORVASC) 10 MG tablet    Sig: Take 1 tablet (10 mg total) by mouth daily.    Dispense:  90 tablet    Refill:  3  . insulin glargine (LANTUS SOLOSTAR) 100 UNIT/ML Solostar Pen    Sig: Inject 50 Units into the skin at bedtime.    Dispense:  5 pen    Refill:  PRN  . atorvastatin (LIPITOR) 10 MG tablet    Sig: Take 1 tablet (10 mg total) by mouth daily.    Dispense:  90 tablet    Refill:  3  . glipiZIDE (GLUCOTROL) 10 MG tablet    Sig: TAKE 1 TABLET BY MOUTH TWICE DAILY BEFORE A MEAL    Dispense:  180 tablet    Refill:  3  . hydrochlorothiazide (HYDRODIURIL) 25 MG tablet    Sig: Take 1 tablet (25 mg total) by mouth daily.    Dispense:  90 tablet    Refill:  3  . ibuprofen (ADVIL) 800 MG tablet    Sig: Take 1 tablet (800 mg total) by mouth every 8 (eight) hours as needed.    Dispense:  30 tablet    Refill:  11  . levocetirizine (XYZAL) 5 MG tablet    Sig: Take 1 tablet (5 mg total) by mouth every evening.    Dispense:  30 tablet    Refill:  11  . lisinopril (ZESTRIL) 20 MG tablet    Sig: Take 1 tablet (20 mg total) by mouth daily.    Dispense:  90 tablet    Refill:  3  . metFORMIN (GLUCOPHAGE) 1000 MG tablet    Sig: Take 1 tablet (1,000 mg total) by mouth 2 (two) times daily with a meal.    Dispense:  180 tablet    Refill:  3  . Vitamin D, Ergocalciferol, (DRISDOL) 1.25 MG (50000 UNIT) CAPS capsule    Sig: Take 1 capsule (50,000 Units total) by mouth every 7 (seven) days.    Dispense:  5 capsule    Refill:  2  . sildenafil (VIAGRA) 100 MG tablet    Sig: Take 0.5-1 tablets (50-100 mg total) by mouth daily as needed for erectile dysfunction.    Dispense:  5 tablet    Refill:  11    Follow-up: No follow-ups on file.    Kallie Locks, FNP

## 2019-09-06 LAB — TESTOSTERONE: Testosterone: 382 ng/dL (ref 264–916)

## 2019-10-05 ENCOUNTER — Ambulatory Visit: Payer: Self-pay | Admitting: Family Medicine

## 2019-10-08 ENCOUNTER — Ambulatory Visit: Payer: Self-pay | Admitting: Family Medicine

## 2019-12-03 ENCOUNTER — Encounter: Payer: Self-pay | Admitting: Family Medicine

## 2019-12-05 ENCOUNTER — Ambulatory Visit: Payer: Self-pay | Admitting: Family Medicine

## 2020-02-22 ENCOUNTER — Ambulatory Visit: Payer: 59 | Admitting: Family Medicine

## 2020-03-24 ENCOUNTER — Ambulatory Visit: Payer: 59 | Admitting: Family Medicine

## 2020-03-31 ENCOUNTER — Ambulatory Visit: Payer: 59 | Admitting: Family Medicine

## 2021-01-23 ENCOUNTER — Ambulatory Visit: Payer: Self-pay | Admitting: Nurse Practitioner

## 2021-01-28 ENCOUNTER — Ambulatory Visit (INDEPENDENT_AMBULATORY_CARE_PROVIDER_SITE_OTHER): Payer: Self-pay | Admitting: Nurse Practitioner

## 2021-01-28 ENCOUNTER — Encounter: Payer: Self-pay | Admitting: Nurse Practitioner

## 2021-01-28 ENCOUNTER — Other Ambulatory Visit: Payer: Self-pay

## 2021-01-28 VITALS — BP 173/84 | HR 80 | Temp 97.9°F | Ht 79.0 in | Wt 241.0 lb

## 2021-01-28 DIAGNOSIS — R1319 Other dysphagia: Secondary | ICD-10-CM

## 2021-01-28 DIAGNOSIS — G43909 Migraine, unspecified, not intractable, without status migrainosus: Secondary | ICD-10-CM

## 2021-01-28 DIAGNOSIS — I10 Essential (primary) hypertension: Secondary | ICD-10-CM

## 2021-01-28 DIAGNOSIS — E785 Hyperlipidemia, unspecified: Secondary | ICD-10-CM

## 2021-01-28 DIAGNOSIS — Z23 Encounter for immunization: Secondary | ICD-10-CM

## 2021-01-28 DIAGNOSIS — E1165 Type 2 diabetes mellitus with hyperglycemia: Secondary | ICD-10-CM

## 2021-01-28 DIAGNOSIS — E559 Vitamin D deficiency, unspecified: Secondary | ICD-10-CM

## 2021-01-28 DIAGNOSIS — Z0000 Encounter for general adult medical examination without abnormal findings: Secondary | ICD-10-CM

## 2021-01-28 DIAGNOSIS — R7309 Other abnormal glucose: Secondary | ICD-10-CM

## 2021-01-28 LAB — GLUCOSE, POCT (MANUAL RESULT ENTRY): POC Glucose: 331 mg/dl — AB (ref 70–99)

## 2021-01-28 LAB — POCT URINALYSIS DIP (CLINITEK)
Bilirubin, UA: NEGATIVE
Blood, UA: NEGATIVE
Glucose, UA: 500 mg/dL — AB
Ketones, POC UA: NEGATIVE mg/dL
Leukocytes, UA: NEGATIVE
Nitrite, UA: NEGATIVE
POC PROTEIN,UA: NEGATIVE
Spec Grav, UA: 1.02 (ref 1.010–1.025)
Urobilinogen, UA: 0.2 E.U./dL
pH, UA: 5.5 (ref 5.0–8.0)

## 2021-01-28 LAB — POCT GLYCOSYLATED HEMOGLOBIN (HGB A1C)

## 2021-01-28 MED ORDER — VITAMIN D (ERGOCALCIFEROL) 1.25 MG (50000 UNIT) PO CAPS: 50000.0000 [IU] | ORAL_CAPSULE | ORAL | 2 refills | Status: DC

## 2021-01-28 MED ORDER — LEVOCETIRIZINE DIHYDROCHLORIDE 5 MG PO TABS: 5.0000 mg | ORAL_TABLET | Freq: Every evening | ORAL | 11 refills | Status: AC

## 2021-01-28 MED ORDER — ATORVASTATIN CALCIUM 10 MG PO TABS: 10.0000 mg | ORAL_TABLET | Freq: Every day | ORAL | 3 refills | Status: DC

## 2021-01-28 MED ORDER — METFORMIN HCL 1000 MG PO TABS: 1000.0000 mg | ORAL_TABLET | Freq: Two times a day (BID) | ORAL | 3 refills | Status: DC

## 2021-01-28 MED ORDER — LISINOPRIL 20 MG PO TABS: 20.0000 mg | ORAL_TABLET | Freq: Every day | ORAL | 3 refills | Status: DC

## 2021-01-28 MED ORDER — GLIPIZIDE 10 MG PO TABS: ORAL_TABLET | ORAL | 3 refills | Status: DC

## 2021-01-28 MED ORDER — AMLODIPINE BESYLATE 10 MG PO TABS: 10.0000 mg | ORAL_TABLET | Freq: Every day | ORAL | 3 refills | Status: DC

## 2021-01-28 MED ORDER — HYDROCHLOROTHIAZIDE 25 MG PO TABS: 25.0000 mg | ORAL_TABLET | Freq: Every day | ORAL | 3 refills | Status: DC

## 2021-01-28 MED ORDER — LANTUS SOLOSTAR 100 UNIT/ML ~~LOC~~ SOPN: 50.0000 [IU] | PEN_INJECTOR | Freq: Every day | SUBCUTANEOUS | 3 refills | Status: DC

## 2021-01-28 NOTE — Patient Instructions (Signed)
You were seen today in the [facility] for follow up of chronic illness and dysphagia. Labs were collected, results will be available via MyChart or, if abnormal, you will be contacted by clinic staff. You were prescribed medications, please take as directed. Please follow up in 1 mth for reevaluation of diabetes, B/P, diet and exercise.

## 2021-01-28 NOTE — Progress Notes (Signed)
[facility] [address] [place], [place]  [address] Phone:  [phone]   Fax:  [phone] Subjective:   Patient ID: [place], male    DOB: 1975-12-04, 45 y.o.   MRN: [id number]  Chief Complaint  Patient presents with   Follow-up    Patient stated he has been having problems with his vocal cords going in and out. Positive for COVID 4 years ago. Requesting ENT referral, swelling both side of neck.    HPI [place] 45 y.o. male  has a past medical history of Decreased libido (08/2019), Diabetes mellitus without complication (HCC), Hyperlipidemia (05/2019), Hypertension, and Vitamin D deficiency (05/2019). To the [facility] for follow up for chronic illness and dysphagia.   Patient states that he has had worsening dysphagia x 4 mths, since contracting COVID. Has had periodic changes in voice with hoarseness and, at times, has lost his voice completely. States that throat feels scratchy and stabbing at times. Endorses having difficulty swallowing and swelling of bilateral neck 1 wk ago. Denies any symptoms during today's appointment. Denies any fever or shortness of breath. Denies feeling mass in throat. Denies any history of tobacco usage/abuse or usage of any other potential harmful substances. Patient requesting referral to ENT. 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   When questioned about management of B/P and diabetes. Indicates that he has not taken any medications since June and prior to that time was taking medications inconsistently. States, " I just got off of my regimen and I need to to restart a new regimen so I can be more consistent with my medications. Denies adhering to any diet and/ exercise regimen. Denies any other complaints today.  Denies any fatigue, chest pain,  shortness of breath, HA or dizziness. Denies any blurred vision, numbness or tingling.   Past Medical History:  Diagnosis Date   Decreased libido 08/2019   Diabetes mellitus without complication (HCC)    Hyperlipidemia 05/2019   Hypertension    Vitamin D deficiency 05/2019    Past Surgical History:  Procedure Laterality Date   BACK SURGERY      Family History  Problem Relation Age of Onset   Diabetes Mellitus II Mother    Diabetes Mellitus II Father     Social History   Socioeconomic History   Marital status: Married    Spouse name: Not on file   Number of children: Not on file   Years of education: Not on file   Highest education level: Not on file  Occupational History   Not on file  Tobacco Use   Smoking status: Never   Smokeless tobacco: Never  Vaping Use   Vaping Use: Never used  Substance and Sexual Activity   Alcohol use: Not Currently   Drug use: Never   Sexual activity: Yes  Other Topics Concern   Not on file  Social History Narrative   Not on file   Social Determinants of Health   Financial Resource Strain: Not on file  Food Insecurity: Not on file  Transportation Needs: Not on file  Physical Activity: Not on file  Stress: Not on file  Social Connections: Not on file  Intimate Partner Violence: Not on file    Outpatient Medications Prior to Visit  Medication Sig Dispense Refill   aspirin  EC 81 MG tablet Take 81 mg by mouth daily.     ibuprofen (ADVIL) 800 MG tablet Take 1 tablet (800 mg total) by mouth every 8 (eight) hours as needed. 30 tablet 11   amLODipine (NORVASC) 10 MG tablet Take 1 tablet (10 mg total) by mouth daily. 90 tablet 3   atorvastatin (LIPITOR) 10 MG tablet Take 1 tablet (10 mg total) by mouth daily. 90 tablet 3   glipiZIDE (GLUCOTROL) 10 MG tablet TAKE 1 TABLET BY MOUTH TWICE DAILY BEFORE A MEAL 180 tablet 3   hydrochlorothiazide (HYDRODIURIL) 25 MG tablet Take 1 tablet (25 mg total) by mouth daily. 90 tablet 3   insulin  glargine (LANTUS SOLOSTAR) 100 UNIT/ML Solostar Pen Inject 50 Units into the skin at bedtime. 5 pen PRN   levocetirizine (XYZAL) 5 MG tablet Take 1 tablet (5 mg total) by mouth every evening. 30 tablet 11   lisinopril (ZESTRIL) 20 MG tablet Take 1 tablet (20 mg total) by mouth daily. 90 tablet 3   metFORMIN (GLUCOPHAGE) 1000 MG tablet Take 1 tablet (1,000 mg total) by mouth 2 (two) times daily with a meal. 180 tablet 3   Vitamin D, Ergocalciferol, (DRISDOL) 1.25 MG (50000 UNIT) CAPS capsule Take 1 capsule (50,000 Units total) by mouth every 7 (seven) days. 5 capsule 2   sildenafil (VIAGRA) 100 MG tablet Take 0.5-1 tablets (50-100 mg total) by mouth daily as needed for erectile dysfunction. (Patient not taking: Reported on 01/28/2021) 5 tablet 11   fluticasone (FLONASE) 50 MCG/ACT nasal spray Place 2 sprays into both nostrils daily. (Patient not taking: Reported on 09/05/2019) 16 g 6   No facility-administered medications prior to visit.    No Known Allergies  Review of Systems  Constitutional:  Negative for chills, fever and malaise/fatigue.  HENT:  Positive for sore throat.        See HPI  Eyes: Negative.   Respiratory:  Negative for cough and shortness of breath.   Cardiovascular:  Negative for chest pain, palpitations and leg swelling.  Gastrointestinal:  Negative for abdominal pain, blood in stool, constipation, diarrhea, nausea and vomiting.  Genitourinary: Negative.   Musculoskeletal: Negative.   Skin: Negative.   Neurological: Negative.   Psychiatric/Behavioral:  Negative for depression. The patient is not nervous/anxious.   All other systems reviewed and are negative.     Objective:    Physical Exam Vitals reviewed.  Constitutional:      General: He is not in acute distress.    Appearance: Normal appearance.  HENT:     Head: Normocephalic.     Right Ear: Tympanic membrane, ear canal and external ear normal.     Left Ear: Tympanic membrane, ear canal and external ear  normal.     Nose: Nose normal.     Mouth/Throat:     Mouth: Mucous membranes are moist.     Pharynx: Oropharynx is clear. Uvula midline. No pharyngeal swelling, oropharyngeal exudate, posterior oropharyngeal erythema or uvula swelling.     Tonsils: No tonsillar exudate or tonsillar abscesses.  Eyes:     Extraocular Movements: Extraocular movements intact.     Conjunctiva/sclera: Conjunctivae normal.     Pupils: Pupils are equal, round, and reactive to light.  Neck:     Thyroid: No thyroid mass, thyromegaly or thyroid tenderness.     Trachea: Trachea normal.  Cardiovascular:     Rate and Rhythm: Normal rate and regular rhythm.     Pulses: Normal pulses.     Heart  sounds: Normal heart sounds.     Comments: No obvious peripheral edema Pulmonary:     Effort: Pulmonary effort is normal.     Breath sounds: Normal breath sounds.  Musculoskeletal:        General: Normal range of motion.     Cervical back: Normal range of motion and neck supple. No rigidity or tenderness.  Lymphadenopathy:     Cervical: No cervical adenopathy.  Skin:    General: Skin is warm and dry.     Capillary Refill: Capillary refill takes less than 2 seconds.  Neurological:     Mental Status: He is alert.  Psychiatric:        Mood and Affect: Mood normal.        Behavior: Behavior normal.        Thought Content: Thought content normal.        Judgment: Judgment normal.    BP (!) 173/84 (BP Location: Right Arm, Patient Position: Sitting)   Pulse 80   Temp 97.9 F (36.6 C)   Ht 6\' 7"  (2.007 m)   Wt 241 lb (109.3 kg)   SpO2 100%   BMI 27.15 kg/m  Wt Readings from Last 3 Encounters:  01/28/21 241 lb (109.3 kg)  09/05/19 246 lb 0.6 oz (111.6 kg)  09/03/19 243 lb (110.2 kg)    Immunization History  Administered Date(s) Administered   Influenza,inj,Quad PF,6+ Mos 01/27/2018, 01/28/2021   PFIZER(Purple Top)SARS-COV-2 Vaccination 07/17/2019, 08/07/2019, 03/12/2020   Tdap 09/23/2017    Diabetic Foot  Exam - Simple   No data filed     No results found for: TSH Lab Results  Component Value Date   WBC 4.9 06/01/2019   HGB 15.0 06/01/2019   HCT 47.1 06/01/2019   MCV 84 06/01/2019   PLT 179 06/01/2019   Lab Results  Component Value Date   NA 140 01/08/2018   K 4.0 01/08/2018   CO2 27 01/08/2018   GLUCOSE 169 (H) 01/08/2018   BUN 9 01/08/2018   CREATININE 0.77 01/08/2018   BILITOT 1.6 (H) 01/08/2018   ALKPHOS 48 01/08/2018   AST 16 01/08/2018   ALT 14 01/08/2018   PROT 6.4 (L) 01/08/2018   ALBUMIN 4.2 01/08/2018   CALCIUM 9.2 01/08/2018   ANIONGAP 8 01/08/2018   Lab Results  Component Value Date   CHOL 226 (H) 06/01/2019   Lab Results  Component Value Date   HDL 41 06/01/2019   Lab Results  Component Value Date   LDLCALC 150 (H) 06/01/2019   Lab Results  Component Value Date   TRIG 193 (H) 06/01/2019   Lab Results  Component Value Date   CHOLHDL 5.5 (H) 06/01/2019   Lab Results  Component Value Date   HGBA1C  01/28/2021     Comment:     >15%   HGBA1C 13.7 (A) 09/03/2019   HGBA1C 13.7 09/03/2019   HGBA1C 13.7 (A) 09/03/2019   HGBA1C 13.7 (A) 09/03/2019       Assessment & Plan:   Problem List Items Addressed This Visit       Cardiovascular and Mediastinum   Essential hypertension   Relevant Medications   amLODipine (NORVASC) 10 MG tablet   atorvastatin (LIPITOR) 10 MG tablet   hydrochlorothiazide (HYDRODIURIL) 25 MG tablet   lisinopril (ZESTRIL) 20 MG tablet   Other Relevant Orders   POCT URINALYSIS DIP (CLINITEK) (Completed)   CBC with Differential/Platelet   Comprehensive metabolic panel   Lipid panel   TSH+T4F+T3Free Encouraged patient to  begin taking B/P at home     Endocrine   Type 2 diabetes mellitus with hyperglycemia (HCC) - Primary   Relevant Medications   atorvastatin (LIPITOR) 10 MG tablet   glipiZIDE (GLUCOTROL) 10 MG tablet   insulin glargine (LANTUS SOLOSTAR) 100 UNIT/ML Solostar Pen   lisinopril (ZESTRIL) 20 MG tablet    metFORMIN (GLUCOPHAGE) 1000 MG tablet   Other Relevant Orders   POCT URINALYSIS DIP (CLINITEK) (Completed)   HgB A1c (Completed)   Glucose (CBG) (Completed)   CBC with Differential/Platelet   Comprehensive metabolic panel   Lipid panel   TSH+T4F+T3Free Encouraged compliance to medications Discussed potential risk factors/ side effects of uncontrolled diabetes and hypertension   Hemoglobin A1C greater than 9%, indicating poor diabetic control   Relevant Medications   atorvastatin (LIPITOR) 10 MG tablet   glipiZIDE (GLUCOTROL) 10 MG tablet   insulin glargine (LANTUS SOLOSTAR) 100 UNIT/ML Solostar Pen   lisinopril (ZESTRIL) 20 MG tablet   metFORMIN (GLUCOPHAGE) 1000 MG tablet   Other Visit Diagnoses     Hyperlipidemia, unspecified hyperlipidemia type       Relevant Medications   amLODipine (NORVASC) 10 MG tablet   atorvastatin (LIPITOR) 10 MG tablet   hydrochlorothiazide (HYDRODIURIL) 25 MG tablet   lisinopril (ZESTRIL) 20 MG tablet   Elevated glucose       Relevant Medications   glipiZIDE (GLUCOTROL) 10 MG tablet   metFORMIN (GLUCOPHAGE) 1000 MG tablet   Migraine without status migrainosus, not intractable, unspecified migraine type       Relevant Medications   amLODipine (NORVASC) 10 MG tablet   atorvastatin (LIPITOR) 10 MG tablet   hydrochlorothiazide (HYDRODIURIL) 25 MG tablet   levocetirizine (XYZAL) 5 MG tablet   lisinopril (ZESTRIL) 20 MG tablet   Vitamin D deficiency       Relevant Medications   Vitamin D, Ergocalciferol, (DRISDOL) 1.25 MG (50000 UNIT) CAPS capsule   Other dysphagia       Relevant Orders   Ambulatory referral to ENT PE benign, imaging deferred until evaluation completed by ENT   Healthcare maintenance       Relevant Orders   Flu Vaccine QUAD 5mo+IM (Fluarix, Fluzone & Alfiuria Quad PF) (Completed) Encouraged continued diet and exercise efforts  Encouraged continued compliance with medication  All medications refilled, will reevaluate chronic  illness(es) at follow up     Follow up in 1 mths for reevaluation of diabetes/ hypertension/ and diet/ exercise, sooner as needed    I have discontinued Xaine A. Ferrin's fluticasone. I am also having him maintain his aspirin EC, ibuprofen, sildenafil, amLODipine, atorvastatin, glipiZIDE, hydrochlorothiazide, Lantus SoloStar, levocetirizine, lisinopril, metFORMIN, and Vitamin D (Ergocalciferol).  Meds ordered this encounter  Medications   amLODipine (NORVASC) 10 MG tablet    Sig: Take 1 tablet (10 mg total) by mouth daily.    Dispense:  90 tablet    Refill:  3   atorvastatin (LIPITOR) 10 MG tablet    Sig: Take 1 tablet (10 mg total) by mouth daily.    Dispense:  90 tablet    Refill:  3   glipiZIDE (GLUCOTROL) 10 MG tablet    Sig: TAKE 1 TABLET BY MOUTH TWICE DAILY BEFORE A MEAL    Dispense:  180 tablet    Refill:  3   hydrochlorothiazide (HYDRODIURIL) 25 MG tablet    Sig: Take 1 tablet (25 mg total) by mouth daily.    Dispense:  90 tablet    Refill:  3   insulin glargine (LANTUS  SOLOSTAR) 100 UNIT/ML Solostar Pen    Sig: Inject 50 Units into the skin at bedtime.    Dispense:  15 mL    Refill:  3   levocetirizine (XYZAL) 5 MG tablet    Sig: Take 1 tablet (5 mg total) by mouth every evening.    Dispense:  30 tablet    Refill:  11   lisinopril (ZESTRIL) 20 MG tablet    Sig: Take 1 tablet (20 mg total) by mouth daily.    Dispense:  90 tablet    Refill:  3   metFORMIN (GLUCOPHAGE) 1000 MG tablet    Sig: Take 1 tablet (1,000 mg total) by mouth 2 (two) times daily with a meal.    Dispense:  180 tablet    Refill:  3   Vitamin D, Ergocalciferol, (DRISDOL) 1.25 MG (50000 UNIT) CAPS capsule    Sig: Take 1 capsule (50,000 Units total) by mouth every 7 (seven) days.    Dispense:  5 capsule    Refill:  2     Kathrynn Speed, NP

## 2021-01-29 LAB — LIPID PANEL
Chol/HDL Ratio: 4.1 ratio (ref 0.0–5.0)
Cholesterol, Total: 212 mg/dL — ABNORMAL HIGH (ref 100–199)
HDL: 52 mg/dL (ref >39)
LDL Chol Calc (NIH): 139 mg/dL — ABNORMAL HIGH (ref 0–99)
Triglycerides: 118 mg/dL (ref 0–149)
VLDL Cholesterol Cal: 21 mg/dL (ref 5–40)

## 2021-01-29 LAB — CBC WITH DIFFERENTIAL/PLATELET
Basophils Absolute: 0 10*3/uL (ref 0.0–0.2)
Basos: 1 %
EOS (ABSOLUTE): 0.1 10*3/uL (ref 0.0–0.4)
Eos: 2 %
Hematocrit: 48.1 % (ref 37.5–51.0)
Hemoglobin: 15.4 g/dL (ref 13.0–17.7)
Immature Grans (Abs): 0 10*3/uL (ref 0.0–0.1)
Immature Granulocytes: 0 %
Lymphocytes Absolute: 1.5 10*3/uL (ref 0.7–3.1)
Lymphs: 33 %
MCH: 26 pg — ABNORMAL LOW (ref 26.6–33.0)
MCHC: 32 g/dL (ref 31.5–35.7)
MCV: 81 fL (ref 79–97)
Monocytes Absolute: 0.3 10*3/uL (ref 0.1–0.9)
Monocytes: 6 %
Neutrophils Absolute: 2.6 10*3/uL (ref 1.4–7.0)
Neutrophils: 58 %
Platelets: 179 10*3/uL (ref 150–450)
RBC: 5.92 x10E6/uL — ABNORMAL HIGH (ref 4.14–5.80)
RDW: 12.5 % (ref 11.6–15.4)
WBC: 4.4 10*3/uL (ref 3.4–10.8)

## 2021-01-29 LAB — TSH+T4F+T3FREE
Free T4: 1.31 ng/dL (ref 0.82–1.77)
T3, Free: 2.6 pg/mL (ref 2.0–4.4)
TSH: 2.06 u[IU]/mL (ref 0.450–4.500)

## 2021-01-29 LAB — COMPREHENSIVE METABOLIC PANEL
ALT: 21 IU/L (ref 0–44)
AST: 17 IU/L (ref 0–40)
Albumin/Globulin Ratio: 2.4 — ABNORMAL HIGH (ref 1.2–2.2)
Albumin: 4.8 g/dL (ref 4.0–5.0)
Alkaline Phosphatase: 74 IU/L (ref 44–121)
BUN/Creatinine Ratio: 19 (ref 9–20)
BUN: 19 mg/dL (ref 6–24)
Bilirubin Total: 0.9 mg/dL (ref 0.0–1.2)
CO2: 25 mmol/L (ref 20–29)
Calcium: 9.6 mg/dL (ref 8.7–10.2)
Chloride: 97 mmol/L (ref 96–106)
Creatinine, Ser: 1.02 mg/dL (ref 0.76–1.27)
Globulin, Total: 2 g/dL (ref 1.5–4.5)
Glucose: 350 mg/dL — ABNORMAL HIGH (ref 70–99)
Potassium: 4.4 mmol/L (ref 3.5–5.2)
Sodium: 136 mmol/L (ref 134–144)
Total Protein: 6.8 g/dL (ref 6.0–8.5)
eGFR: 92 mL/min/{1.73_m2} (ref >59)

## 2021-02-27 ENCOUNTER — Ambulatory Visit: Payer: Medicaid Other | Admitting: Nurse Practitioner

## 2021-03-04 ENCOUNTER — Ambulatory Visit: Payer: Medicaid Other | Admitting: Nurse Practitioner

## 2022-02-26 ENCOUNTER — Ambulatory Visit: Payer: Self-pay | Admitting: Nurse Practitioner

## 2022-03-09 ENCOUNTER — Encounter: Payer: Self-pay | Admitting: Nurse Practitioner

## 2022-03-09 ENCOUNTER — Ambulatory Visit: Payer: Medicaid Other | Admitting: Nurse Practitioner

## 2022-03-09 VITALS — BP 137/86 | HR 101 | Temp 98.2°F | Ht 79.0 in | Wt 231.0 lb

## 2022-03-09 DIAGNOSIS — E1169 Type 2 diabetes mellitus with other specified complication: Secondary | ICD-10-CM | POA: Insufficient documentation

## 2022-03-09 DIAGNOSIS — E785 Hyperlipidemia, unspecified: Secondary | ICD-10-CM

## 2022-03-09 DIAGNOSIS — I10 Essential (primary) hypertension: Secondary | ICD-10-CM | POA: Diagnosis not present

## 2022-03-09 DIAGNOSIS — E1165 Type 2 diabetes mellitus with hyperglycemia: Secondary | ICD-10-CM

## 2022-03-09 DIAGNOSIS — E1349 Other specified diabetes mellitus with other diabetic neurological complication: Secondary | ICD-10-CM

## 2022-03-09 DIAGNOSIS — E1140 Type 2 diabetes mellitus with diabetic neuropathy, unspecified: Secondary | ICD-10-CM | POA: Insufficient documentation

## 2022-03-09 DIAGNOSIS — Z1211 Encounter for screening for malignant neoplasm of colon: Secondary | ICD-10-CM | POA: Diagnosis not present

## 2022-03-09 LAB — POCT GLYCOSYLATED HEMOGLOBIN (HGB A1C): Hemoglobin A1C: 12.8 % — AB (ref 4.0–5.6)

## 2022-03-09 MED ORDER — LISINOPRIL 40 MG PO TABS: 40.0000 mg | ORAL_TABLET | Freq: Every day | ORAL | 0 refills | Status: DC

## 2022-03-09 MED ORDER — AMLODIPINE BESYLATE 10 MG PO TABS: 10.0000 mg | ORAL_TABLET | Freq: Every day | ORAL | 3 refills | Status: DC

## 2022-03-09 MED ORDER — ONETOUCH VERIO FLEX SYSTEM W/DEVICE KIT: PACK | 0 refills | Status: DC

## 2022-03-09 MED ORDER — ATORVASTATIN CALCIUM 10 MG PO TABS: 10.0000 mg | ORAL_TABLET | Freq: Every day | ORAL | 0 refills | Status: DC

## 2022-03-09 MED ORDER — HYDROCHLOROTHIAZIDE 25 MG PO TABS: 25.0000 mg | ORAL_TABLET | Freq: Every day | ORAL | 3 refills | Status: DC

## 2022-03-09 MED ORDER — METFORMIN HCL 1000 MG PO TABS: 1000.0000 mg | ORAL_TABLET | Freq: Two times a day (BID) | ORAL | 3 refills | Status: DC

## 2022-03-09 MED ORDER — GLIPIZIDE 10 MG PO TABS: ORAL_TABLET | ORAL | 3 refills | Status: DC

## 2022-03-09 MED ORDER — GABAPENTIN 100 MG PO CAPS: 100.0000 mg | ORAL_CAPSULE | Freq: Three times a day (TID) | ORAL | 3 refills | Status: DC

## 2022-03-09 MED ORDER — EMPAGLIFLOZIN 10 MG PO TABS: 10.0000 mg | ORAL_TABLET | Freq: Every day | ORAL | 0 refills | Status: DC

## 2022-03-09 NOTE — Assessment & Plan Note (Signed)
BP Readings from Last 3 Encounters:  03/09/22 137/86  01/28/21 (!) 173/84  09/05/19 (!) 146/83  Currently on amlodipine 10 mg daily, hydrochlorothiazide 25 mg daily, lisinopril 20 mg daily.  Stat lisinopril 40 mg daily continue current dose of amlodipine and hydrochlorothiazide DASH diet advised engage in regular moderate exercise and at least 250 minutes weekly Blood pressure goal is less than 130/80. CMP today

## 2022-03-09 NOTE — Assessment & Plan Note (Signed)
LDL goal is less than 70 Continue atorvastatin 10 mg daily avoid fatty fried foods Checking lipid panel

## 2022-03-09 NOTE — Patient Instructions (Signed)
  Goal for fasting blood sugar ranges from 80 to 120 and 2 hours after any meal or at bedtime should be between 130 to 170.    Type 2 diabetes mellitus with hyperglycemia, without long-term current use of insulin (HCC)  - POCT glycosylated hemoglobin (Hb A1C) - Ambulatory referral to Ophthalmology - Urine microalbumin-creatinine with uACR - CMP14+EGFR - empagliflozin (JARDIANCE) 10 MG TABS tablet; Take 1 tablet (10 mg total) by mouth daily before breakfast.  Dispense: 60 tablet; Refill: 0 - glipiZIDE (GLUCOTROL) 10 MG tablet; TAKE 1 TABLET BY MOUTH TWICE DAILY BEFORE A MEAL  Dispense: 180 tablet; Refill: 3 - metFORMIN (GLUCOPHAGE) 1000 MG tablet; Take 1 tablet (1,000 mg total) by mouth 2 (two) times daily with a meal.  Dispense: 180 tablet; Refill: 3    Type 2 diabetes mellitus with hyperglycemia, without long-term current use of insulin (HCC) - POCT glycosylated hemoglobin (Hb A1C) - Ambulatory referral to Ophthalmology - Urine microalbumin-creatinine with uACR - CMP14+EGFR - empagliflozin (JARDIANCE) 10 MG TABS tablet; Take 1 tablet (10 mg total) by mouth daily before breakfast.  Dispense: 60 tablet; Refill: 0 - glipiZIDE (GLUCOTROL) 10 MG tablet; TAKE 1 TABLET BY MOUTH TWICE DAILY BEFORE A MEAL  Dispense: 180 tablet; Refill: 3 - metFORMIN (GLUCOPHAGE) 1000 MG tablet; Take 1 tablet (1,000 mg total) by mouth 2 (two) times daily with a meal.  Dispense: 180 tablet; Refill: 3   Essential hypertension  - amLODipine (NORVASC) 10 MG tablet; Take 1 tablet (10 mg total) by mouth daily.  Dispense: 90 tablet; Refill: 3 - lisinopril (ZESTRIL) 40 MG tablet; Take 1 tablet (40 mg total) by mouth daily.  Dispense: 90 tablet; Refill: 0 - hydrochlorothiazide (HYDRODIURIL) 25 MG tablet; Take 1 tablet (25 mg total) by mouth daily.  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  Dispense: 90 capsule; Refill:     It is important that you exercise regularly at least 30 minutes 5 times a week as tolerated  Think about what you will eat, plan ahead. Choose " clean, green, fresh or frozen" over canned, processed or packaged foods which are more sugary, salty and fatty. 70 to 75% of food eaten should be vegetables and fruit. Three meals at set times with snacks allowed between meals, but they must be fruit or vegetables. Aim to eat over a 12 hour period , example 7 am to 7 pm, and STOP after  your last meal of the day. Drink water,generally about 64 ounces per day, no other drink is as healthy. Fruit juice is best enjoyed in a healthy way, by EATING the fruit.  Thanks for choosing [facility] we consider it a privelige to serve you.

## 2022-03-09 NOTE — Assessment & Plan Note (Signed)
Bilateral foot, has no sensation on monofilament examination of right foot, decreased sensation on left foot.  Need to get diabetes under control discussed.  Need for regular foot exam wearing comfortable shoes and footwear always discussed with the patient Start gabapentin 100 mg 3 times daily

## 2022-03-09 NOTE — Assessment & Plan Note (Addendum)
Lab Results  Component Value Date   HGBA1C 12.8 (A) 03/09/2022  Diabetes remains uncontrolled has been off his medication for months until restarting them 2 weeks ago Currently on glipizide 10 mg twice daily, metformin 1000 mg twice daily.  He has not been taking insulin.  Continue current dose of glipizide and metformin.  Start Jardiance 10 mg daily. Will hold off on restarting lantus Patient encouraged to avoid fatty fried foods, eat smaller portion of meals while taking Jardiance to help decrease risk of GI upset. Diabetes foot exam completed today referred to ophthalmology for diabetic eye exam urine creatinine albumin ratio labs ordered. Patient counseled on low-carb modified diet.  Follow-up in 4 weeks Glucose meter and supplies ordered.  CBG goals were discussed with the patient

## 2022-03-09 NOTE — Progress Notes (Signed)
New Patient Office Visit  Subjective:  Patient ID: Noah Bates, male    DOB: 1975-12-07  Age: 47 y.o. MRN: [id number]  CC:  Chief Complaint  Patient presents with   Follow-up    Pt stated--left is worse than right foot--burning sensation, numbness.    HPI Noah Bates is a 47 y.o. male with past medical history of uncontrolled type 2 diabetes, hypertension, hyperlipidemia, vitamin D deficiency presents to establish care with new provider and for follow-up for his chronic medical conditions.  Patient was last seen at this office by Verlon Setting in 2022.  Patient stated that he has been off all his medications for months and recently restarted his medications about 2 weeks ago.  He complains of burning sensation, numbness in bilateral foot worse on the left foot. Review of patient's chart shows that he has problems with med adherence in the past    T2DM.  He was on metformin 1000 mg twice daily, glipizide 10 mg twice daily, Lantus.  He had stopped taking all medications until about 2 weeks ago.  He denies polyphagia, polyuria, polydipsia.  He has not been checking his blood sugars  Hypertension.  He is currently on amlodipine 10 mg daily, hydrochlorothiazide 25 mg daily, lisinopril 20 mg daily.  He denies chest pain, edema, syncope.  Due for colon cancer screening, Cologuard ordered today.   Past Medical History:  Diagnosis Date   Decreased libido 08/2019   Diabetes mellitus without complication ([place])    Hyperlipidemia 05/2019   Hypertension    Vitamin D deficiency 05/2019    Past Surgical History:  Procedure Laterality Date   BACK SURGERY      Family History  Problem Relation Age of Onset   Diabetes Mellitus II Mother    Diabetes Mellitus II Father    Stroke Father    Cancer - Colon Neg Hx     Social History   Socioeconomic History   Marital status: Married    Spouse name: Not on file   Number of children: 4   Years of education: Not on file    Highest education level: Not on file  Occupational History   Not on file  Tobacco Use   Smoking status: Never   Smokeless tobacco: Never  Vaping Use   Vaping Use: Never used  Substance and Sexual Activity   Alcohol use: Not Currently   Drug use: Never   Sexual activity: Yes  Other Topics Concern   Not on file  Social History Narrative   Lives with wife.    Social Determinants of Health   Financial Resource Strain: Not on file  Food Insecurity: Not on file  Transportation Needs: Not on file  Physical Activity: Not on file  Stress: Not on file  Social Connections: Not on file  Intimate Partner Violence: Not on file    ROS Review of Systems  Constitutional:  Negative for activity change, appetite change, chills, diaphoresis, fatigue, fever and unexpected weight change.  Respiratory:  Negative for apnea, cough, chest tightness, shortness of breath and wheezing.   Cardiovascular:  Negative for chest pain, palpitations and leg swelling.  Gastrointestinal: Negative.  Negative for abdominal distention, abdominal pain and anal bleeding.  Endocrine: Negative for polydipsia, polyphagia and polyuria.  Neurological:  Positive for numbness. Negative for dizziness, seizures, syncope, facial asymmetry, speech difficulty, light-headedness and headaches.  Psychiatric/Behavioral:  Negative for agitation, behavioral problems, confusion, decreased concentration, dysphoric mood and hallucinations.     Objective:  Today's Vitals: BP 137/86   Pulse (!) 101   Temp 98.2 F (36.8 C)   Ht _0  (2.007 m)   Wt 231 lb (104.8 kg)   SpO2 99%   BMI 26.02 kg/m   Physical Exam Constitutional:      General: He is not in acute distress.    Appearance: Normal appearance. He is not ill-appearing, toxic-appearing or diaphoretic.  Eyes:     General: No scleral icterus.       Right eye: No discharge.        Left eye: No discharge.     Extraocular Movements: Extraocular movements intact.      Conjunctiva/sclera: Conjunctivae normal.  Cardiovascular:     Rate and Rhythm: Normal rate.     Pulses: Normal pulses.     Heart sounds: Normal heart sounds. No murmur heard.    No friction rub. No gallop.  Pulmonary:     Effort: Pulmonary effort is normal. No respiratory distress.     Breath sounds: Normal breath sounds. No stridor. No wheezing, rhonchi or rales.  Chest:     Chest wall: No tenderness.  Abdominal:     General: There is no distension.     Palpations: Abdomen is soft.     Tenderness: There is no abdominal tenderness. There is no guarding.  Musculoskeletal:        General: No swelling, tenderness, deformity or signs of injury.     Right lower leg: No edema.     Left lower leg: No edema.  Skin:    General: Skin is warm.     Capillary Refill: Capillary refill takes less than 2 seconds.  Neurological:     Mental Status: He is alert and oriented to person, place, and time.     Sensory: Sensory deficit present.     Motor: No weakness.     Coordination: Coordination normal.     Gait: Gait normal.  Psychiatric:        Mood and Affect: Mood normal.        Behavior: Behavior normal.        Judgment: Judgment normal.     Assessment & Plan:   Problem List Items Addressed This Visit       Cardiovascular and Mediastinum   Essential hypertension    BP Readings from Last 3 Encounters:  03/09/22 137/86  01/28/21 (!) 173/84  09/05/19 (!) 146/83  Currently on amlodipine 10 mg daily, hydrochlorothiazide 25 mg daily, lisinopril 20 mg daily.  Stat lisinopril 40 mg daily continue current dose of amlodipine and hydrochlorothiazide DASH diet advised engage in regular moderate exercise and at least 250 minutes weekly Blood pressure goal is less than 130/80. CMP today      Relevant Medications   amLODipine (NORVASC) 10 MG tablet   lisinopril (ZESTRIL) 40 MG tablet   hydrochlorothiazide (HYDRODIURIL) 25 MG tablet   atorvastatin (LIPITOR) 10 MG tablet     Endocrine   Type  2 diabetes mellitus with hyperglycemia (HCC) - Primary    Lab Results  Component Value Date   HGBA1C 12.8 (A) 03/09/2022  Diabetes remains uncontrolled has been off his medication for months until restarting them 2 weeks ago Currently on glipizide 10 mg twice daily, metformin 1000 mg twice daily.  He has not been taking insulin.  Continue current dose of glipizide and metformin.  Start Jardiance 10 mg daily. Will hold off on restarting lantus Patient encouraged to avoid fatty fried foods, eat smaller portion of  meals while taking Jardiance to help decrease risk of GI upset. Diabetes foot exam completed today referred to ophthalmology for diabetic eye exam urine creatinine albumin ratio labs ordered. Patient counseled on low-carb modified diet.  Follow-up in 4 weeks Glucose meter and supplies ordered.  CBG goals were discussed with the patient      Relevant Medications   empagliflozin (JARDIANCE) 10 MG TABS tablet   glipiZIDE (GLUCOTROL) 10 MG tablet   metFORMIN (GLUCOPHAGE) 1000 MG tablet   lisinopril (ZESTRIL) 40 MG tablet   atorvastatin (LIPITOR) 10 MG tablet   Blood Glucose Monitoring Suppl ([place]) w/Device KIT   Other Relevant Orders   POCT glycosylated hemoglobin (Hb A1C) (Completed)   Ambulatory referral to Ophthalmology   Urine microalbumin-creatinine with uACR   CMP14+EGFR   Diabetic neuropathy (HCC)    Bilateral foot, has no sensation on monofilament examination of right foot, decreased sensation on left foot.  Need to get diabetes under control discussed.  Need for regular foot exam wearing comfortable shoes and footwear always discussed with the patient Start gabapentin 100 mg 3 times daily      Relevant Medications   empagliflozin (JARDIANCE) 10 MG TABS tablet   glipiZIDE (GLUCOTROL) 10 MG tablet   metFORMIN (GLUCOPHAGE) 1000 MG tablet   lisinopril (ZESTRIL) 40 MG tablet   atorvastatin (LIPITOR) 10 MG tablet   gabapentin (NEURONTIN) 100 MG capsule      Other   Hyperlipidemia    LDL goal is less than 70 Continue atorvastatin 10 mg daily avoid fatty fried foods Checking lipid panel      Relevant Medications   amLODipine (NORVASC) 10 MG tablet   lisinopril (ZESTRIL) 40 MG tablet   hydrochlorothiazide (HYDRODIURIL) 25 MG tablet   atorvastatin (LIPITOR) 10 MG tablet   Other Relevant Orders   Lipid Panel   Other Visit Diagnoses     Screening for colon cancer       Relevant Orders   Cologuard       Outpatient Encounter Medications as of 03/09/2022  Medication Sig   aspirin EC 81 MG tablet Take 81 mg by mouth daily.   Blood Glucose Monitoring Suppl (ONETOUCH VERIO FLEX SYSTEM) w/Device KIT Check sugar twice a day.   empagliflozin (JARDIANCE) 10 MG TABS tablet Take 1 tablet (10 mg total) by mouth daily before breakfast.   gabapentin (NEURONTIN) 100 MG capsule Take 1 capsule (100 mg total) by mouth 3 (three) times daily.   ibuprofen (ADVIL) 800 MG tablet Take 1 tablet (800 mg total) by mouth every 8 (eight) hours as needed.   levocetirizine (XYZAL) 5 MG tablet Take 1 tablet (5 mg total) by mouth every evening.   lisinopril (ZESTRIL) 40 MG tablet Take 1 tablet (40 mg total) by mouth daily.   [DISCONTINUED] amLODipine (NORVASC) 10 MG tablet Take 1 tablet (10 mg total) by mouth daily.   [DISCONTINUED] atorvastatin (LIPITOR) 10 MG tablet Take 1 tablet (10 mg total) by mouth daily.   [DISCONTINUED] glipiZIDE (GLUCOTROL) 10 MG tablet TAKE 1 TABLET BY MOUTH TWICE DAILY BEFORE A MEAL   [DISCONTINUED] hydrochlorothiazide (HYDRODIURIL) 25 MG tablet Take 1 tablet (25 mg total) by mouth daily.   [DISCONTINUED] lisinopril (ZESTRIL) 20 MG tablet Take 1 tablet (20 mg total) by mouth daily.   [DISCONTINUED] metFORMIN (GLUCOPHAGE) 1000 MG tablet Take 1 tablet (1,000 mg total) by mouth 2 (two) times daily with a meal.   amLODipine (NORVASC) 10 MG tablet Take 1 tablet (10 mg total) by mouth  daily.   atorvastatin (LIPITOR) 10 MG tablet Take 1 tablet  (10 mg total) by mouth daily.   glipiZIDE (GLUCOTROL) 10 MG tablet TAKE 1 TABLET BY MOUTH TWICE DAILY BEFORE A MEAL   hydrochlorothiazide (HYDRODIURIL) 25 MG tablet Take 1 tablet (25 mg total) by mouth daily.   insulin glargine (LANTUS SOLOSTAR) 100 UNIT/ML Solostar Pen Inject 50 Units into the skin at bedtime. (Patient not taking: Reported on 03/09/2022)   metFORMIN (GLUCOPHAGE) 1000 MG tablet Take 1 tablet (1,000 mg total) by mouth 2 (two) times daily with a meal.   sildenafil (VIAGRA) 100 MG tablet Take 0.5-1 tablets (50-100 mg total) by mouth daily as needed for erectile dysfunction. (Patient not taking: Reported on 01/28/2021)   Vitamin D, Ergocalciferol, (DRISDOL) 1.25 MG (50000 UNIT) CAPS capsule Take 1 capsule (50,000 Units total) by mouth every 7 (seven) days. (Patient not taking: Reported on 03/09/2022)   [DISCONTINUED] glipiZIDE (GLUCOTROL) 10 MG tablet TAKE 1 TABLET BY MOUTH TWICE DAILY BEFORE A MEAL   No facility-administered encounter medications on file as of 03/09/2022.    Follow-up: Return in about 4 weeks (around 04/06/2022) for diabetes.   Renee Rival, FNP

## 2022-03-10 LAB — CMP14+EGFR
ALT: 15 IU/L (ref 0–44)
AST: 13 IU/L (ref 0–40)
Albumin/Globulin Ratio: 1.8 (ref 1.2–2.2)
Albumin: 4.8 g/dL (ref 4.1–5.1)
Alkaline Phosphatase: 82 IU/L (ref 44–121)
BUN/Creatinine Ratio: 9 (ref 9–20)
BUN: 9 mg/dL (ref 6–24)
Bilirubin Total: 1 mg/dL (ref 0.0–1.2)
CO2: 25 mmol/L (ref 20–29)
Calcium: 9.3 mg/dL (ref 8.7–10.2)
Chloride: 96 mmol/L (ref 96–106)
Creatinine, Ser: 1.03 mg/dL (ref 0.76–1.27)
Globulin, Total: 2.6 g/dL (ref 1.5–4.5)
Glucose: 292 mg/dL — ABNORMAL HIGH (ref 70–99)
Potassium: 4 mmol/L (ref 3.5–5.2)
Sodium: 137 mmol/L (ref 134–144)
Total Protein: 7.4 g/dL (ref 6.0–8.5)
eGFR: 90 mL/min/{1.73_m2} (ref >59)

## 2022-03-10 LAB — MICROALBUMIN / CREATININE URINE RATIO
Creatinine, Urine: 94.6 mg/dL
Microalb/Creat Ratio: 39 mg/g creat — ABNORMAL HIGH (ref 0–29)
Microalbumin, Urine: 36.7 ug/mL

## 2022-03-10 LAB — LIPID PANEL
Chol/HDL Ratio: 3.4 ratio (ref 0.0–5.0)
Cholesterol, Total: 159 mg/dL (ref 100–199)
HDL: 47 mg/dL (ref >39)
LDL Chol Calc (NIH): 99 mg/dL (ref 0–99)
Triglycerides: 63 mg/dL (ref 0–149)
VLDL Cholesterol Cal: 13 mg/dL (ref 5–40)

## 2022-03-10 NOTE — Progress Notes (Signed)
Microalbuminuria. It is very important that we get his diabetes under control to prevent further damage to his kidneys. . Take all medications as prescribed and follow up as planned.

## 2022-03-17 DIAGNOSIS — E1165 Type 2 diabetes mellitus with hyperglycemia: Secondary | ICD-10-CM

## 2022-03-19 ENCOUNTER — Other Ambulatory Visit: Payer: Self-pay | Admitting: Nurse Practitioner

## 2022-03-19 ENCOUNTER — Telehealth: Payer: Self-pay | Admitting: Nurse Practitioner

## 2022-03-19 MED ORDER — ACCU-CHEK GUIDE VI STRP: ORAL_STRIP | 12 refills | Status: DC

## 2022-03-19 NOTE — Telephone Encounter (Signed)
Please call pt regarding Mychart messages and his medications. [phone]

## 2022-03-22 NOTE — Telephone Encounter (Signed)
Pt advised he was fine . [place]

## 2022-03-24 ENCOUNTER — Ambulatory Visit (INDEPENDENT_AMBULATORY_CARE_PROVIDER_SITE_OTHER): Payer: Medicaid Other

## 2022-03-24 DIAGNOSIS — Z23 Encounter for immunization: Secondary | ICD-10-CM

## 2022-03-26 ENCOUNTER — Other Ambulatory Visit: Payer: Self-pay

## 2022-03-26 DIAGNOSIS — E1165 Type 2 diabetes mellitus with hyperglycemia: Secondary | ICD-10-CM

## 2022-03-26 MED ORDER — GLUCOSE BLOOD VI STRP: ORAL_STRIP | 12 refills | Status: DC

## 2022-03-26 NOTE — Progress Notes (Signed)
Pt had a flu shot . No issues or concerns.

## 2022-03-26 NOTE — Telephone Encounter (Signed)
Pt was called to confirm the glucose machine and to let him know that  they were sent in. [facility]

## 2022-04-02 ENCOUNTER — Encounter: Payer: Self-pay | Admitting: Pharmacist

## 2022-04-05 ENCOUNTER — Other Ambulatory Visit: Payer: Self-pay

## 2022-04-05 DIAGNOSIS — E1165 Type 2 diabetes mellitus with hyperglycemia: Secondary | ICD-10-CM

## 2022-04-05 MED ORDER — ACCU-CHEK AVIVA PLUS W/DEVICE KIT: 1.0000 | PACK | Freq: Two times a day (BID) | 0 refills | Status: DC

## 2022-04-05 MED ORDER — ACCU-CHEK SOFTCLIX LANCETS MISC: 12 refills | Status: DC

## 2022-04-05 MED ORDER — ACCU-CHEK GUIDE W/DEVICE KIT: 1.0000 | PACK | Freq: Two times a day (BID) | 0 refills | Status: DC

## 2022-04-06 ENCOUNTER — Ambulatory Visit: Payer: Medicaid Other | Admitting: Nurse Practitioner

## 2022-04-06 ENCOUNTER — Encounter: Payer: Self-pay | Admitting: Nurse Practitioner

## 2022-04-06 VITALS — BP 129/78 | HR 93 | Temp 98.1°F | Resp 20 | Wt 236.0 lb

## 2022-04-06 DIAGNOSIS — E785 Hyperlipidemia, unspecified: Secondary | ICD-10-CM

## 2022-04-06 DIAGNOSIS — E1169 Type 2 diabetes mellitus with other specified complication: Secondary | ICD-10-CM | POA: Diagnosis not present

## 2022-04-06 DIAGNOSIS — I10 Essential (primary) hypertension: Secondary | ICD-10-CM

## 2022-04-06 DIAGNOSIS — N529 Male erectile dysfunction, unspecified: Secondary | ICD-10-CM | POA: Diagnosis not present

## 2022-04-06 DIAGNOSIS — E1165 Type 2 diabetes mellitus with hyperglycemia: Secondary | ICD-10-CM | POA: Diagnosis not present

## 2022-04-06 MED ORDER — EMPAGLIFLOZIN 10 MG PO TABS: 10.0000 mg | ORAL_TABLET | Freq: Every day | ORAL | 0 refills | Status: DC

## 2022-04-06 NOTE — Progress Notes (Signed)
Established Patient Office Visit  Subjective:  Patient ID: Noah Bates, male    DOB: March 03, 1976  Age: 47 y.o. MRN: [id number]  CC: No chief complaint on file.   HPI Noah Bates is a 47 y.o. male with past medical history of hypertension, uncontrolled type 2 diabetes, hyperlipidemia, vitamin D deficiency, erectile dysfunction presents for follow-up for hypertension and diabetes.   Hypertension.  Currently on amlodipine 10 mg daily, losartan 40 mg daily, hydrochlorothiazide 25 mg daily.  Blood pressure readings at home has been well-controlled under 130/80.  Patient denies chest pain, dizziness, syncope.  Type 2 diabetes.  Currently on metformin 1000 mg twice daily, glipizide 10 mg twice daily, Jardiance 10 mg daily.  Patient denies nausea vomiting abdominal pain polyuria, polyphagia, polydipsia.  He just picked up his glucose meter and plans on checking his blood sugar as advised.   Patient complains of chronic erectile dysfunction, he was on Viagra in the past had some adverse reactions to the medication, referral to urology discussed with the patient.      Past Medical History:  Diagnosis Date   Decreased libido 08/2019   Diabetes mellitus without complication ([place])    Hyperlipidemia 05/2019   Hypertension    Vitamin D deficiency 05/2019    Past Surgical History:  Procedure Laterality Date   BACK SURGERY      Family History  Problem Relation Age of Onset   Diabetes Mellitus II Mother    Diabetes Mellitus II Father    Stroke Father    Cancer - Colon Neg Hx     Social History   Socioeconomic History   Marital status: Married    Spouse name: Not on file   Number of children: 4   Years of education: Not on file   Highest education level: Not on file  Occupational History   Not on file  Tobacco Use   Smoking status: Never   Smokeless tobacco: Never  Vaping Use   Vaping Use: Never used  Substance and Sexual Activity   Alcohol use: Not  Currently   Drug use: Never   Sexual activity: Yes  Other Topics Concern   Not on file  Social History Narrative   Lives with wife.    Social Determinants of Health   Financial Resource Strain: Not on file  Food Insecurity: Not on file  Transportation Needs: Not on file  Physical Activity: Not on file  Stress: Not on file  Social Connections: Not on file  Intimate Partner Violence: Not on file    Outpatient Medications Prior to Visit  Medication Sig Dispense Refill   amLODipine (NORVASC) 10 MG tablet Take 1 tablet (10 mg total) by mouth daily. 90 tablet 3   aspirin EC 81 MG tablet Take 81 mg by mouth daily.     atorvastatin (LIPITOR) 10 MG tablet Take 1 tablet (10 mg total) by mouth daily. 60 tablet 0   gabapentin (NEURONTIN) 100 MG capsule Take 1 capsule (100 mg total) by mouth 3 (three) times daily. 90 capsule 3   glipiZIDE (GLUCOTROL) 10 MG tablet TAKE 1 TABLET BY MOUTH TWICE DAILY BEFORE A MEAL 180 tablet 3   hydrochlorothiazide (HYDRODIURIL) 25 MG tablet Take 1 tablet (25 mg total) by mouth daily. 90 tablet 3   levocetirizine (XYZAL) 5 MG tablet Take 1 tablet (5 mg total) by mouth every evening. 30 tablet 11   lisinopril (ZESTRIL) 40 MG tablet Take 1 tablet (40 mg total) by mouth daily. [place]  tablet 0   metFORMIN (GLUCOPHAGE) 1000 MG tablet Take 1 tablet (1,000 mg total) by mouth 2 (two) times daily with a meal. 180 tablet 3   sildenafil (VIAGRA) 100 MG tablet Take 0.5-1 tablets (50-100 mg total) by mouth daily as needed for erectile dysfunction. 5 tablet 11   empagliflozin (JARDIANCE) 10 MG TABS tablet Take 1 tablet (10 mg total) by mouth daily before breakfast. 60 tablet 0   Accu-Chek Softclix Lancets lancets Use as instructed to check blood sugar twice daily. 100 each 12   Blood Glucose Monitoring Suppl (ACCU-CHEK GUIDE) w/Device KIT 1 each by Does not apply route in the morning and at bedtime. 1 kit 0   glucose blood test strip Use as instructed to check blood sugar level  twice a day 200 each 12   ibuprofen (ADVIL) 800 MG tablet Take 1 tablet (800 mg total) by mouth every 8 (eight) hours as needed. (Patient not taking: Reported on 04/06/2022) 30 tablet 11   Vitamin D, Ergocalciferol, (DRISDOL) 1.25 MG (50000 UNIT) CAPS capsule Take 1 capsule (50,000 Units total) by mouth every 7 (seven) days. (Patient not taking: Reported on 03/09/2022) 5 capsule 2   insulin glargine (LANTUS SOLOSTAR) 100 UNIT/ML Solostar Pen Inject 50 Units into the skin at bedtime. (Patient not taking: Reported on 03/09/2022) 15 mL 3   No facility-administered medications prior to visit.    No Known Allergies  ROS Review of Systems  Constitutional:  Negative for activity change, appetite change, chills, diaphoresis, fatigue, fever and unexpected weight change.  Respiratory: Negative.  Negative for apnea, choking, chest tightness, shortness of breath, wheezing and stridor.   Cardiovascular: Negative.  Negative for chest pain, palpitations and leg swelling.  Gastrointestinal:  Negative for abdominal distention, abdominal pain, anal bleeding, blood in stool and constipation.  Endocrine: Negative for polydipsia, polyphagia and polyuria.  Neurological: Negative.  Negative for dizziness, seizures, syncope, facial asymmetry, light-headedness and headaches.  Psychiatric/Behavioral: Negative.  Negative for agitation, behavioral problems, confusion, decreased concentration and dysphoric mood.       Objective:    Physical Exam Constitutional:      General: He is not in acute distress.    Appearance: Normal appearance. He is not ill-appearing, toxic-appearing or diaphoretic.  Eyes:     General: No scleral icterus.       Right eye: No discharge.        Left eye: No discharge.     Extraocular Movements: Extraocular movements intact.     Conjunctiva/sclera: Conjunctivae normal.  Cardiovascular:     Rate and Rhythm: Normal rate and regular rhythm.     Pulses: Normal pulses.     Heart sounds: Normal  heart sounds. No murmur heard.    No friction rub. No gallop.  Pulmonary:     Effort: Pulmonary effort is normal. No respiratory distress.     Breath sounds: Normal breath sounds. No stridor. No wheezing, rhonchi or rales.  Chest:     Chest wall: No tenderness.  Abdominal:     General: There is no distension.     Palpations: Abdomen is soft. There is no mass.     Tenderness: There is no abdominal tenderness. There is no guarding.  Musculoskeletal:        General: No swelling, tenderness, deformity or signs of injury. Normal range of motion.     Right lower leg: No edema.     Left lower leg: No edema.  Skin:    General: Skin is warm and  dry.     Coloration: Skin is not jaundiced or pale.     Findings: No bruising or erythema.  Neurological:     Mental Status: He is alert and oriented to person, place, and time.     Cranial Nerves: No cranial nerve deficit.     Sensory: No sensory deficit.     Motor: No weakness.     Coordination: Coordination normal.     Gait: Gait normal.  Psychiatric:        Mood and Affect: Mood normal.        Behavior: Behavior normal.        Thought Content: Thought content normal.        Judgment: Judgment normal.     BP 129/78   Pulse 93   Temp 98.1 F (36.7 C)   Resp 20   Wt 236 lb (107 kg)   SpO2 100%   BMI 26.59 kg/m  Wt Readings from Last 3 Encounters:  04/06/22 236 lb (107 kg)  03/09/22 231 lb (104.8 kg)  01/28/21 241 lb (109.3 kg)    Lab Results  Component Value Date   TSH 2.060 01/28/2021   Lab Results  Component Value Date   WBC 4.4 01/28/2021   HGB 15.4 01/28/2021   HCT 48.1 01/28/2021   MCV 81 01/28/2021   PLT 179 01/28/2021   Lab Results  Component Value Date   NA 137 03/09/2022   K 4.0 03/09/2022   CO2 25 03/09/2022   GLUCOSE 292 (H) 03/09/2022   BUN 9 03/09/2022   CREATININE 1.03 03/09/2022   BILITOT 1.0 03/09/2022   ALKPHOS 82 03/09/2022   AST 13 03/09/2022   ALT 15 03/09/2022   PROT 7.4 03/09/2022    ALBUMIN 4.8 03/09/2022   CALCIUM 9.3 03/09/2022   ANIONGAP 8 01/08/2018   EGFR 90 03/09/2022   Lab Results  Component Value Date   CHOL 159 03/09/2022   Lab Results  Component Value Date   HDL 47 03/09/2022   Lab Results  Component Value Date   LDLCALC 99 03/09/2022   Lab Results  Component Value Date   TRIG 63 03/09/2022   Lab Results  Component Value Date   CHOLHDL 3.4 03/09/2022   Lab Results  Component Value Date   HGBA1C 12.8 (A) 03/09/2022      Assessment & Plan:   Problem List Items Addressed This Visit       Cardiovascular and Mediastinum   Essential hypertension - Primary    BP Readings from Last 3 Encounters:  04/06/22 129/78  03/09/22 137/86  01/28/21 (!) 173/84    Currently on amlodipine 10 mg daily, losartan 40 mg daily, hydrochlorothiazide 25 mg daily.  Blood pressure readings at home has been well-controlled under 130/80.  Patient denies chest pain, dizziness, syncope. Condition currently well-controlled Continue current medications BMP today DASH diet advised engage in regular moderate exercises at least 150 minutes weekly as tolerated. Follow-up in 2 months      Relevant Orders   Basic Metabolic Panel     Endocrine   Type 2 diabetes mellitus with hyperglycemia (HCC)    Chronic uncontrolled condition currently on metformin 1000 mg twice daily, glipizide 10 mg twice daily, Jardiance 10 mg daily.  Patient denies nausea vomiting abdominal pain polyuria, polyphagia, polydipsia.  He just picked up his glucose meter and plans on checking his blood sugar as advised.  Continue current medications CBG goals discussed Follow-up in 2 months for recheck A1c Avoid sugar  sweets soda, engage in regular daily exercise          Relevant Medications   empagliflozin (JARDIANCE) 10 MG TABS tablet   Other Relevant Orders   Basic Metabolic Panel   Hyperlipidemia associated with type 2 diabetes mellitus (HCC)    Lab Results  Component Value Date    CHOL 159 03/09/2022   HDL 47 03/09/2022   LDLCALC 99 03/09/2022   TRIG 63 03/09/2022   CHOLHDL 3.4 03/09/2022  LDL goal is less than 70 Continue atorvastatin 10 mg daily will check labs at next visit.       Relevant Medications   empagliflozin (JARDIANCE) 10 MG TABS tablet     Other   Erectile dysfunction    Uncontrolled type 2 diabetes could be contributing to this, working on getting diabetes under control. Was on Viagra at 1 time but not taking medication currently due to its  side effects Patient referred to urology.      Relevant Orders   Ambulatory referral to Urology    Meds ordered this encounter  Medications   empagliflozin (JARDIANCE) 10 MG TABS tablet    Sig: Take 1 tablet (10 mg total) by mouth daily before breakfast.    Dispense:  60 tablet    Refill:  0    Follow-up: Return in about 2 months (around 06/05/2022) for CPE. Donell Beers, FNP

## 2022-04-06 NOTE — Assessment & Plan Note (Signed)
Chronic uncontrolled condition currently on metformin 1000 mg twice daily, glipizide 10 mg twice daily, Jardiance 10 mg daily.  Patient denies nausea vomiting abdominal pain polyuria, polyphagia, polydipsia.  He just picked up his glucose meter and plans on checking his blood sugar as advised.  Continue current medications CBG goals discussed Follow-up in 2 months for recheck A1c Avoid sugar sweets soda, engage in regular daily exercise

## 2022-04-06 NOTE — Assessment & Plan Note (Signed)
Uncontrolled type 2 diabetes could be contributing to this, working on getting diabetes under control. Was on Viagra at 1 time but not taking medication currently due to its  side effects Patient referred to urology.

## 2022-04-06 NOTE — Patient Instructions (Signed)
Goal for fasting blood sugar ranges from 80 to 120 and 2 hours after any meal or at bedtime should be between 130 to 170.   It is important that you exercise regularly at least 30 minutes 5 times a week as tolerated  Think about what you will eat, plan ahead. Choose " clean, green, fresh or frozen" over canned, processed or packaged foods which are more sugary, salty and fatty. 70 to 75% of food eaten should be vegetables and fruit. Three meals at set times with snacks allowed between meals, but they must be fruit or vegetables. Aim to eat over a 12 hour period , example 7 am to 7 pm, and STOP after  your last meal of the day. Drink water,generally about 64 ounces per day, no other drink is as healthy. Fruit juice is best enjoyed in a healthy way, by EATING the fruit.  Thanks for choosing [facility] we consider it a privelige to serve you.  

## 2022-04-06 NOTE — Assessment & Plan Note (Signed)
BP Readings from Last 3 Encounters:  04/06/22 129/78  03/09/22 137/86  01/28/21 (!) 173/84    Currently on amlodipine 10 mg daily, losartan 40 mg daily, hydrochlorothiazide 25 mg daily.  Blood pressure readings at home has been well-controlled under 130/80.  Patient denies chest pain, dizziness, syncope. Condition currently well-controlled Continue current medications BMP today DASH diet advised engage in regular moderate exercises at least 150 minutes weekly as tolerated. Follow-up in 2 months

## 2022-04-06 NOTE — Assessment & Plan Note (Signed)
Lab Results  Component Value Date   CHOL 159 03/09/2022   HDL 47 03/09/2022   LDLCALC 99 03/09/2022   TRIG 63 03/09/2022   CHOLHDL 3.4 03/09/2022  LDL goal is less than 70 Continue atorvastatin 10 mg daily will check labs at next visit.

## 2022-04-07 LAB — BASIC METABOLIC PANEL
BUN/Creatinine Ratio: 19 (ref 9–20)
BUN: 17 mg/dL (ref 6–24)
CO2: 22 mmol/L (ref 20–29)
Calcium: 9.7 mg/dL (ref 8.7–10.2)
Chloride: 99 mmol/L (ref 96–106)
Creatinine, Ser: 0.89 mg/dL (ref 0.76–1.27)
Glucose: 87 mg/dL (ref 70–99)
Potassium: 5 mmol/L (ref 3.5–5.2)
Sodium: 139 mmol/L (ref 134–144)
eGFR: 106 mL/min/{1.73_m2} (ref >59)

## 2022-04-08 ENCOUNTER — Other Ambulatory Visit: Payer: Self-pay

## 2022-04-08 DIAGNOSIS — E1165 Type 2 diabetes mellitus with hyperglycemia: Secondary | ICD-10-CM

## 2022-04-08 LAB — COLOGUARD: COLOGUARD: NEGATIVE

## 2022-04-08 MED ORDER — GLUCOSE BLOOD VI STRP: ORAL_STRIP | 12 refills | Status: DC

## 2022-04-08 MED ORDER — ACCU-CHEK GUIDE W/DEVICE KIT: 1.0000 | PACK | Freq: Two times a day (BID) | 0 refills | Status: AC

## 2022-04-09 NOTE — Progress Notes (Signed)
Normal cologuard repeat in 3 years

## 2022-05-07 ENCOUNTER — Other Ambulatory Visit: Payer: Self-pay | Admitting: Nurse Practitioner

## 2022-05-07 DIAGNOSIS — E1169 Type 2 diabetes mellitus with other specified complication: Secondary | ICD-10-CM

## 2022-05-07 MED ORDER — SILDENAFIL CITRATE 100 MG PO TABS: 50.0000 mg | ORAL_TABLET | Freq: Every day | ORAL | 11 refills | Status: DC | PRN

## 2022-06-01 ENCOUNTER — Ambulatory Visit: Payer: Medicaid Other | Admitting: Nurse Practitioner

## 2022-06-04 ENCOUNTER — Ambulatory Visit: Payer: Medicaid Other | Admitting: Nurse Practitioner

## 2022-06-21 ENCOUNTER — Ambulatory Visit: Payer: Medicaid Other | Admitting: Nurse Practitioner

## 2022-06-28 ENCOUNTER — Other Ambulatory Visit: Payer: Self-pay

## 2022-06-28 DIAGNOSIS — E1165 Type 2 diabetes mellitus with hyperglycemia: Secondary | ICD-10-CM

## 2022-06-28 MED ORDER — EMPAGLIFLOZIN 10 MG PO TABS: 10.0000 mg | ORAL_TABLET | Freq: Every day | ORAL | 0 refills | Status: DC

## 2022-07-08 ENCOUNTER — Other Ambulatory Visit: Payer: Self-pay

## 2022-07-08 DIAGNOSIS — E1165 Type 2 diabetes mellitus with hyperglycemia: Secondary | ICD-10-CM

## 2022-08-10 NOTE — Telephone Encounter (Signed)
Done letter was mailed and sent to my chart. Kh

## 2022-10-05 ENCOUNTER — Other Ambulatory Visit: Payer: Self-pay | Admitting: Nurse Practitioner

## 2022-10-05 DIAGNOSIS — E1349 Other specified diabetes mellitus with other diabetic neurological complication: Secondary | ICD-10-CM

## 2022-10-05 DIAGNOSIS — I10 Essential (primary) hypertension: Secondary | ICD-10-CM

## 2022-10-05 DIAGNOSIS — E785 Hyperlipidemia, unspecified: Secondary | ICD-10-CM

## 2022-10-05 DIAGNOSIS — E1165 Type 2 diabetes mellitus with hyperglycemia: Secondary | ICD-10-CM

## 2022-10-18 ENCOUNTER — Encounter: Payer: Self-pay | Admitting: Nurse Practitioner

## 2022-10-18 ENCOUNTER — Ambulatory Visit: Payer: Medicaid Other | Admitting: Nurse Practitioner

## 2022-10-18 VITALS — BP 128/79 | HR 88 | Ht 79.0 in | Wt 234.4 lb

## 2022-10-18 DIAGNOSIS — I10 Essential (primary) hypertension: Secondary | ICD-10-CM

## 2022-10-18 DIAGNOSIS — E785 Hyperlipidemia, unspecified: Secondary | ICD-10-CM | POA: Diagnosis not present

## 2022-10-18 DIAGNOSIS — E1149 Type 2 diabetes mellitus with other diabetic neurological complication: Secondary | ICD-10-CM

## 2022-10-18 DIAGNOSIS — E1169 Type 2 diabetes mellitus with other specified complication: Secondary | ICD-10-CM

## 2022-10-18 DIAGNOSIS — R058 Other specified cough: Secondary | ICD-10-CM | POA: Insufficient documentation

## 2022-10-18 DIAGNOSIS — T464X5A Adverse effect of angiotensin-converting-enzyme inhibitors, initial encounter: Secondary | ICD-10-CM | POA: Insufficient documentation

## 2022-10-18 LAB — POCT GLYCOSYLATED HEMOGLOBIN (HGB A1C): Hemoglobin A1C: 7.9 % — AB (ref 4.0–5.6)

## 2022-10-18 MED ORDER — VALSARTAN 80 MG PO TABS: 80.0000 mg | ORAL_TABLET | Freq: Every day | ORAL | 3 refills | Status: DC

## 2022-10-18 MED ORDER — EMPAGLIFLOZIN 25 MG PO TABS: 25.0000 mg | ORAL_TABLET | Freq: Every day | ORAL | 1 refills | Status: DC

## 2022-10-18 MED ORDER — METFORMIN HCL 1000 MG PO TABS
1000.0000 mg | ORAL_TABLET | Freq: Two times a day (BID) | ORAL | 3 refills | Status: DC
Start: 2022-10-18 — End: 2023-05-23

## 2022-10-18 MED ORDER — HYDROCHLOROTHIAZIDE 25 MG PO TABS
25.0000 mg | ORAL_TABLET | Freq: Every day | ORAL | 3 refills | Status: DC
Start: 2022-10-18 — End: 2023-05-23

## 2022-10-18 MED ORDER — AMLODIPINE BESYLATE 10 MG PO TABS
10.0000 mg | ORAL_TABLET | Freq: Every day | ORAL | 3 refills | Status: DC
Start: 2022-10-18 — End: 2023-05-23

## 2022-10-18 MED ORDER — GLIPIZIDE 5 MG PO TABS
5.0000 mg | ORAL_TABLET | Freq: Two times a day (BID) | ORAL | 3 refills | Status: DC
Start: 2022-10-18 — End: 2023-05-23

## 2022-10-18 NOTE — Assessment & Plan Note (Addendum)
Lab Results  Component Value Date   HGBA1C 7.9 (A) 10/18/2022  A1c much improved from 12.8 7 months ago Patient congratulated on his efforts at getting his diabetes under control Start Jardiance 25 mg daily, glipizide decreased to 5 mg twice daily to prevent hypoglycemia.  Continue metformin 1000 mg twice daily CBG goals discussed Need for regular moderate exercises at least 150 minutes weekly, importance of low-carb modified diet discussed Takes atorvastatin 10 mg daily for hyperlipidemia LDL goal is less than 70.  Patient not fasting today we will check lipid panel at next visit

## 2022-10-18 NOTE — Patient Instructions (Signed)
1. Type 2 diabetes mellitus with hyperglycemia, without long-term current use of insulin (HCC)  - POCT glycosylated hemoglobin (Hb A1C) - empagliflozin (JARDIANCE) 25 MG TABS tablet; Take 1 tablet (25 mg total) by mouth daily before breakfast.  Dispense: 90 tablet; Refill: 1 - glipiZIDE (GLUCOTROL) 5 MG tablet; Take 1 tablet (5 mg total) by mouth 2 (two) times daily before a meal.  Dispense: 60 tablet; Refill: 3  2. Essential hypertension  - valsartan (DIOVAN) 80 MG tablet; Take 1 tablet (80 mg total) by mouth daily.  Dispense: 90 tablet; Refill: 3   Goal for fasting blood sugar ranges from 80 to 120 and 2 hours after any meal or at bedtime should be between 130 to 170.  A1C <7%, limited only by hypoglycemia      Hypoglycemia: a)  Do not drive or operate machinery without first testing blood glucose to assure it is over 90 mg%, or if dizzy, lightheaded, not feeling normal, etc, or  if foot or leg is numb or weak. b)  If blood glucose less than 70, take four 5gm Glucose tabs or 15-30 gm Glucose gel.  Repeat every 15 min as needed until blood sugar is >100 mg/dl. If hypoglycemia persists then call 911.    6. Sick day management: a) Check blood glucose more often b) Continue usual therapy if blood sugars are elevated.    Contact the doctor immediately if blood glucose is frequently <60 mg/dl, or an episode of severe hypoglycemia occurs (where someone had to give you glucose/  glucagon or if you passed out from a low blood glucose), or if blood glucose is persistently >350 mg/dl, for further management      It is important that you exercise regularly at least 30 minutes 5 times a week as tolerated  Think about what you will eat, plan ahead. Choose " clean, green, fresh or frozen" over canned, processed or packaged foods which are more sugary, salty and fatty. 70 to 75% of food eaten should be vegetables and fruit. Three meals at set times with snacks allowed between meals, but they  must be fruit or vegetables. Aim to eat over a 12 hour period , example 7 am to 7 pm, and STOP after  your last meal of the day. Drink water,generally about 64 ounces per day, no other drink is as healthy. Fruit juice is best enjoyed in a healthy way, by EATING the fruit.  Thanks for choosing [facility] we consider it a privelige to serve you.

## 2022-10-18 NOTE — Assessment & Plan Note (Signed)
Continue gabapentin 100 mg 3 times daily. 

## 2022-10-18 NOTE — Progress Notes (Signed)
Established Patient Office Visit  Subjective:  Patient ID: Noah Bates, male    DOB: Jan 08, 1976  Age: 47 y.o. MRN: [id number]  CC:  Chief Complaint  Patient presents with   Medical Management of Chronic Issues    HPI Noah Bates is a 47 y.o. male  has a past medical history of Decreased libido (08/2019), Diabetes mellitus without complication (HCC), Hyperlipidemia (05/2019), Hypertension, and Vitamin D deficiency (05/2019).  Patient presents for follow-up for his chronic medical conditions  Hypertension.  Currently on lisinopril 40 mg daily, hydrochlorothiazide 25 mg daily, amlodipine 10 mg daily.  Patient denies chest pain, shortness of breath, dizziness.  He reports chronic cough since he had COVID in 2021.  ENT thinks that lisinopril might be causing his chronic cough and advised that the medication should be discontinued.  Type 2 diabetes.  Currently on glipizide 10 mg twice daily, Jardiance 10 mg daily, metformin 1000 mg twice daily.  Patient reports random blood sugar readings of 70s to 130s.  He has been on low-carb modified diet mainly, he does not exercise currently but plans to start exercising at the [facility] .  Takes atorvastatin 10 mg daily.    Past Medical History:  Diagnosis Date   Decreased libido 08/2019   Diabetes mellitus without complication (HCC)    Hyperlipidemia 05/2019   Hypertension    Vitamin D deficiency 05/2019    Past Surgical History:  Procedure Laterality Date   BACK SURGERY      Family History  Problem Relation Age of Onset   Diabetes Mellitus II Mother    Diabetes Mellitus II Father    Stroke Father    Cancer - Colon Neg Hx     Social History   Socioeconomic History   Marital status: Married    Spouse name: Not on file   Number of children: 4   Years of education: Not on file   Highest education level: Not on file  Occupational History   Not on file  Tobacco Use   Smoking status: Never   Smokeless tobacco: Never   Vaping Use   Vaping status: Never Used  Substance and Sexual Activity   Alcohol use: Not Currently   Drug use: Never   Sexual activity: Yes  Other Topics Concern   Not on file  Social History Narrative   Lives with wife.    Social Determinants of Health   Financial Resource Strain: Not on file  Food Insecurity: Not on file  Transportation Needs: Not on file  Physical Activity: Not on file  Stress: Not on file  Social Connections: Not on file  Intimate Partner Violence: Not on file    Outpatient Medications Prior to Visit  Medication Sig Dispense Refill   Accu-Chek Softclix Lancets lancets Use as instructed to check blood sugar twice daily. 100 each 12   aspirin EC 81 MG tablet Take 81 mg by mouth daily.     atorvastatin (LIPITOR) 10 MG tablet Take 1 tablet by mouth once daily 60 tablet 0   Blood Glucose Monitoring Suppl (ACCU-CHEK GUIDE) w/Device KIT 1 each by Does not apply route in the morning and at bedtime. 1 kit 0   gabapentin (NEURONTIN) 100 MG capsule TAKE 1 CAPSULE BY MOUTH THREE TIMES DAILY 90 capsule 0   glucose blood test strip Use as instructed to check blood sugar level twice a day 200 each 12   levocetirizine (XYZAL) 5 MG tablet Take 1 tablet (5 mg total) by mouth  every evening. 30 tablet 11   omeprazole (PRILOSEC) 40 MG capsule Take 40 mg by mouth daily.     sildenafil (VIAGRA) 100 MG tablet Take 0.5-1 tablets (50-100 mg total) by mouth daily as needed for erectile dysfunction. 5 tablet 11   amLODipine (NORVASC) 10 MG tablet Take 1 tablet (10 mg total) by mouth daily. 90 tablet 3   empagliflozin (JARDIANCE) 10 MG TABS tablet Take 1 tablet (10 mg total) by mouth daily before breakfast. 90 tablet 0   glipiZIDE (GLUCOTROL) 10 MG tablet TAKE 1 TABLET BY MOUTH TWICE DAILY BEFORE A MEAL 180 tablet 3   hydrochlorothiazide (HYDRODIURIL) 25 MG tablet Take 1 tablet (25 mg total) by mouth daily. 90 tablet 3   lisinopril (ZESTRIL) 40 MG tablet Take 1 tablet by mouth once  daily 90 tablet 0   metFORMIN (GLUCOPHAGE) 1000 MG tablet Take 1 tablet (1,000 mg total) by mouth 2 (two) times daily with a meal. 180 tablet 3   ibuprofen (ADVIL) 800 MG tablet Take 1 tablet (800 mg total) by mouth every 8 (eight) hours as needed. (Patient not taking: Reported on 04/06/2022) 30 tablet 11   Vitamin D, Ergocalciferol, (DRISDOL) 1.25 MG (50000 UNIT) CAPS capsule Take 1 capsule (50,000 Units total) by mouth every 7 (seven) days. (Patient not taking: Reported on 03/09/2022) 5 capsule 2   No facility-administered medications prior to visit.    No Known Allergies  ROS Review of Systems  Constitutional:  Negative for activity change, appetite change, chills, diaphoresis, fatigue, fever and unexpected weight change.  HENT:  Negative for congestion, dental problem, drooling and ear discharge.   Eyes:  Negative for pain, discharge, redness and itching.  Respiratory:  Positive for cough. Negative for apnea, choking, chest tightness, shortness of breath and wheezing.        Chronic cough  Cardiovascular: Negative.  Negative for chest pain, palpitations and leg swelling.  Gastrointestinal:  Negative for abdominal distention, abdominal pain, anal bleeding, blood in stool, constipation, diarrhea and vomiting.  Endocrine: Negative for polydipsia, polyphagia and polyuria.  Genitourinary:  Negative for difficulty urinating, flank pain, frequency and genital sores.  Musculoskeletal: Negative.  Negative for arthralgias, back pain, gait problem and joint swelling.  Skin:  Negative for color change, pallor and rash.  Neurological:  Negative for dizziness, facial asymmetry, light-headedness, numbness and headaches.  Psychiatric/Behavioral:  Negative for agitation, behavioral problems, confusion, hallucinations, self-injury, sleep disturbance and suicidal ideas.       Objective:    Physical Exam Vitals and nursing note reviewed.  Constitutional:      General: He is not in acute distress.     Appearance: Normal appearance. He is not ill-appearing, toxic-appearing or diaphoretic.  HENT:     Mouth/Throat:     Mouth: Mucous membranes are moist.     Pharynx: Oropharynx is clear. No oropharyngeal exudate or posterior oropharyngeal erythema.  Eyes:     General: No scleral icterus.       Right eye: No discharge.        Left eye: No discharge.     Extraocular Movements: Extraocular movements intact.     Conjunctiva/sclera: Conjunctivae normal.  Cardiovascular:     Rate and Rhythm: Normal rate and regular rhythm.     Pulses: Normal pulses.     Heart sounds: Normal heart sounds. No murmur heard.    No friction rub. No gallop.  Pulmonary:     Effort: Pulmonary effort is normal. No respiratory distress.  Breath sounds: Normal breath sounds. No stridor. No wheezing, rhonchi or rales.  Chest:     Chest wall: No tenderness.  Abdominal:     General: There is no distension.     Palpations: Abdomen is soft.     Tenderness: There is no abdominal tenderness. There is no right CVA tenderness, left CVA tenderness or guarding.  Musculoskeletal:        General: No swelling, tenderness, deformity or signs of injury.     Right lower leg: No edema.     Left lower leg: No edema.  Skin:    General: Skin is warm and dry.     Capillary Refill: Capillary refill takes less than 2 seconds.     Coloration: Skin is not jaundiced or pale.     Findings: No bruising, erythema or lesion.  Neurological:     Mental Status: He is alert and oriented to person, place, and time.     Motor: No weakness.     Coordination: Coordination normal.     Gait: Gait normal.  Psychiatric:        Mood and Affect: Mood normal.        Behavior: Behavior normal.        Thought Content: Thought content normal.        Judgment: Judgment normal.     BP 128/79   Pulse 88   Ht 6\' 7"  (2.007 m)   Wt 234 lb 6.4 oz (106.3 kg)   SpO2 97%   BMI 26.41 kg/m  Wt Readings from Last 3 Encounters:  10/18/22 234 lb 6.4 oz  (106.3 kg)  04/06/22 236 lb (107 kg)  03/09/22 231 lb (104.8 kg)    Lab Results  Component Value Date   TSH 2.060 01/28/2021   Lab Results  Component Value Date   WBC 4.4 01/28/2021   HGB 15.4 01/28/2021   HCT 48.1 01/28/2021   MCV 81 01/28/2021   PLT 179 01/28/2021   Lab Results  Component Value Date   NA 139 04/06/2022   K 5.0 04/06/2022   CO2 22 04/06/2022   GLUCOSE 87 04/06/2022   BUN 17 04/06/2022   CREATININE 0.89 04/06/2022   BILITOT 1.0 03/09/2022   ALKPHOS 82 03/09/2022   AST 13 03/09/2022   ALT 15 03/09/2022   PROT 7.4 03/09/2022   ALBUMIN 4.8 03/09/2022   CALCIUM 9.7 04/06/2022   ANIONGAP 8 01/08/2018   EGFR 106 04/06/2022   Lab Results  Component Value Date   CHOL 159 03/09/2022   Lab Results  Component Value Date   HDL 47 03/09/2022   Lab Results  Component Value Date   LDLCALC 99 03/09/2022   Lab Results  Component Value Date   TRIG 63 03/09/2022   Lab Results  Component Value Date   CHOLHDL 3.4 03/09/2022   Lab Results  Component Value Date   HGBA1C 7.9 (A) 10/18/2022      Assessment & Plan:   Problem List Items Addressed This Visit       Cardiovascular and Mediastinum   Essential hypertension - Primary    BP Readings from Last 3 Encounters:  10/18/22 128/79  04/06/22 129/78  03/09/22 137/86   HTN Controlled on amlodipine 10 mg daily, hydrochlorothiazide 25 mg daily, lisinopril 40 mg daily.  Due to complaints of possible ACE induced cough lisinopril discontinued today.  Start valsartan 80 mg daily continue current dose of amlodipine and hydrochlorothiazide. Continue current medications. No changes in management. Discussed DASH  diet and dietary sodium restrictions Continue to increase dietary efforts and exercise.  CMP today Blood pressure goal is less than 130/80 Follow-up in the office in 4 weeks        Relevant Medications   valsartan (DIOVAN) 80 MG tablet   amLODipine (NORVASC) 10 MG tablet   hydrochlorothiazide  (HYDRODIURIL) 25 MG tablet   Other Relevant Orders   CMP14+EGFR     Endocrine   Diabetic neuropathy (HCC)    Continue gabapentin 100 mg 3 times daily       Relevant Medications   valsartan (DIOVAN) 80 MG tablet   empagliflozin (JARDIANCE) 25 MG TABS tablet   glipiZIDE (GLUCOTROL) 5 MG tablet   metFORMIN (GLUCOPHAGE) 1000 MG tablet   Hyperlipidemia associated with type 2 diabetes mellitus (HCC)    Lab Results  Component Value Date   HGBA1C 7.9 (A) 10/18/2022  A1c much improved from 12.8 7 months ago Patient congratulated on his efforts at getting his diabetes under control Start Jardiance 25 mg daily, glipizide decreased to 5 mg twice daily to prevent hypoglycemia.  Continue metformin 1000 mg twice daily CBG goals discussed Need for regular moderate exercises at least 150 minutes weekly, importance of low-carb modified diet discussed Takes atorvastatin 10 mg daily for hyperlipidemia LDL goal is less than 70.  Patient not fasting today we will check lipid panel at next visit      Relevant Medications   valsartan (DIOVAN) 80 MG tablet   empagliflozin (JARDIANCE) 25 MG TABS tablet   glipiZIDE (GLUCOTROL) 5 MG tablet   metFORMIN (GLUCOPHAGE) 1000 MG tablet   amLODipine (NORVASC) 10 MG tablet   hydrochlorothiazide (HYDRODIURIL) 25 MG tablet   Other Relevant Orders   POCT glycosylated hemoglobin (Hb A1C) (Completed)   CMP14+EGFR    Meds ordered this encounter  Medications   valsartan (DIOVAN) 80 MG tablet    Sig: Take 1 tablet (80 mg total) by mouth daily.    Dispense:  90 tablet    Refill:  3   empagliflozin (JARDIANCE) 25 MG TABS tablet    Sig: Take 1 tablet (25 mg total) by mouth daily before breakfast.    Dispense:  90 tablet    Refill:  1   glipiZIDE (GLUCOTROL) 5 MG tablet    Sig: Take 1 tablet (5 mg total) by mouth 2 (two) times daily before a meal.    Dispense:  60 tablet    Refill:  3   metFORMIN (GLUCOPHAGE) 1000 MG tablet    Sig: Take 1 tablet (1,000 mg total)  by mouth 2 (two) times daily with a meal.    Dispense:  180 tablet    Refill:  3   amLODipine (NORVASC) 10 MG tablet    Sig: Take 1 tablet (10 mg total) by mouth daily.    Dispense:  90 tablet    Refill:  3   hydrochlorothiazide (HYDRODIURIL) 25 MG tablet    Sig: Take 1 tablet (25 mg total) by mouth daily.    Dispense:  90 tablet    Refill:  3    Follow-up: Return in about 4 weeks (around 11/15/2022) for HTN, DM.    Donell Beers, FNP

## 2022-10-18 NOTE — Assessment & Plan Note (Signed)
BP Readings from Last 3 Encounters:  10/18/22 128/79  04/06/22 129/78  03/09/22 137/86   HTN Controlled on amlodipine 10 mg daily, hydrochlorothiazide 25 mg daily, lisinopril 40 mg daily.  Due to complaints of possible ACE induced cough lisinopril discontinued today.  Start valsartan 80 mg daily continue current dose of amlodipine and hydrochlorothiazide. Continue current medications. No changes in management. Discussed DASH diet and dietary sodium restrictions Continue to increase dietary efforts and exercise.  CMP today Blood pressure goal is less than 130/80 Follow-up in the office in 4 weeks

## 2022-10-30 ENCOUNTER — Other Ambulatory Visit: Payer: Self-pay | Admitting: Nurse Practitioner

## 2022-10-30 DIAGNOSIS — E1349 Other specified diabetes mellitus with other diabetic neurological complication: Secondary | ICD-10-CM

## 2022-11-01 ENCOUNTER — Other Ambulatory Visit: Payer: Self-pay | Admitting: Nurse Practitioner

## 2022-11-01 DIAGNOSIS — E1349 Other specified diabetes mellitus with other diabetic neurological complication: Secondary | ICD-10-CM

## 2022-11-01 MED ORDER — GABAPENTIN 100 MG PO CAPS
100.0000 mg | ORAL_CAPSULE | Freq: Three times a day (TID) | ORAL | 2 refills | Status: DC
Start: 2022-11-01 — End: 2023-03-30

## 2022-11-15 ENCOUNTER — Ambulatory Visit: Payer: Medicaid Other | Admitting: Nurse Practitioner

## 2022-11-19 ENCOUNTER — Ambulatory Visit: Payer: Medicaid Other | Admitting: Nurse Practitioner

## 2023-02-25 ENCOUNTER — Encounter: Payer: Self-pay | Admitting: Nurse Practitioner

## 2023-02-25 NOTE — Telephone Encounter (Signed)
 Care team updated and letter sent for eye exam notes.

## 2023-03-29 ENCOUNTER — Other Ambulatory Visit: Payer: Self-pay

## 2023-03-29 ENCOUNTER — Encounter (HOSPITAL_COMMUNITY): Payer: Self-pay

## 2023-03-29 ENCOUNTER — Emergency Department (HOSPITAL_COMMUNITY): Payer: Medicaid Other

## 2023-03-29 ENCOUNTER — Ambulatory Visit
Admission: EM | Admit: 2023-03-29 | Discharge: 2023-03-29 | Disposition: A | Discharge status: Home / Self Care | Payer: Medicaid Other | Attending: Family Medicine | Admitting: Family Medicine

## 2023-03-29 ENCOUNTER — Emergency Department (HOSPITAL_COMMUNITY)
Admission: EM | Admit: 2023-03-29 | Discharge: 2023-03-29 | Disposition: A | Discharge status: Home / Self Care | Payer: Medicaid Other | Attending: Emergency Medicine | Admitting: Emergency Medicine

## 2023-03-29 DIAGNOSIS — R9431 Abnormal electrocardiogram [ECG] [EKG]: Secondary | ICD-10-CM

## 2023-03-29 DIAGNOSIS — Z7982 Long term (current) use of aspirin: Secondary | ICD-10-CM | POA: Diagnosis not present

## 2023-03-29 DIAGNOSIS — Z7984 Long term (current) use of oral hypoglycemic drugs: Secondary | ICD-10-CM | POA: Insufficient documentation

## 2023-03-29 DIAGNOSIS — R079 Chest pain, unspecified: Secondary | ICD-10-CM | POA: Diagnosis not present

## 2023-03-29 DIAGNOSIS — R030 Elevated blood-pressure reading, without diagnosis of hypertension: Secondary | ICD-10-CM

## 2023-03-29 DIAGNOSIS — Z79899 Other long term (current) drug therapy: Secondary | ICD-10-CM | POA: Insufficient documentation

## 2023-03-29 DIAGNOSIS — E119 Type 2 diabetes mellitus without complications: Secondary | ICD-10-CM | POA: Diagnosis not present

## 2023-03-29 DIAGNOSIS — R0789 Other chest pain: Secondary | ICD-10-CM | POA: Insufficient documentation

## 2023-03-29 DIAGNOSIS — I10 Essential (primary) hypertension: Secondary | ICD-10-CM | POA: Diagnosis not present

## 2023-03-29 LAB — CBC
HCT: 43.5 % (ref 39.0–52.0)
Hemoglobin: 14.5 g/dL (ref 13.0–17.0)
MCH: 27 pg (ref 26.0–34.0)
MCHC: 33.3 g/dL (ref 30.0–36.0)
MCV: 80.9 fL (ref 80.0–100.0)
Platelets: 165 10*3/uL (ref 150–400)
RBC: 5.38 MIL/uL (ref 4.22–5.81)
RDW: 12.8 % (ref 11.5–15.5)
WBC: 3.6 10*3/uL — ABNORMAL LOW (ref 4.0–10.5)
nRBC: 0 % (ref 0.0–0.2)

## 2023-03-29 LAB — BASIC METABOLIC PANEL
Anion gap: 9 (ref 5–15)
BUN: 13 mg/dL (ref 6–20)
CO2: 28 mmol/L (ref 22–32)
Calcium: 9.3 mg/dL (ref 8.9–10.3)
Chloride: 101 mmol/L (ref 98–111)
Creatinine, Ser: 0.86 mg/dL (ref 0.61–1.24)
GFR, Estimated: >60 mL/min (ref >60)
Glucose, Bld: 307 mg/dL — ABNORMAL HIGH (ref 70–99)
Potassium: 4 mmol/L (ref 3.5–5.1)
Sodium: 138 mmol/L (ref 135–145)

## 2023-03-29 LAB — TROPONIN I (HIGH SENSITIVITY)
Troponin I (High Sensitivity): 4 ng/L (ref <18)
Troponin I (High Sensitivity): 4 ng/L (ref <18)

## 2023-03-29 LAB — D-DIMER, QUANTITATIVE: D-Dimer, Quant: 0.3 ug{FEU}/mL (ref 0.00–0.50)

## 2023-03-29 MED ORDER — KETOROLAC TROMETHAMINE 10 MG PO TABS: 10.0000 mg | ORAL_TABLET | Freq: Four times a day (QID) | ORAL | 0 refills | Status: DC | PRN

## 2023-03-29 MED ORDER — KETOROLAC TROMETHAMINE 15 MG/ML IJ SOLN
15.0000 mg | Freq: Once | INTRAMUSCULAR | Status: AC
  Administered 2023-03-29: 15 mg via INTRAVENOUS
  Filled 2023-03-29: qty 1

## 2023-03-29 NOTE — ED Provider Notes (Signed)
[facility] Provider Note   CSN: [id number] Arrival date & time: 03/29/23  1155     History  Chief Complaint  Patient presents with   Chest Pain    Noah Bates is a 48 y.o. male.   Chest Pain   48 year old male presents emergency department complaints of left-sided chest pain.  Chest pain present intermittently for the past couple of weeks.  Reports pain worsened with certain movements as well as positional changes.  States it is somewhat random at times though coming and going lasting anywhere from minutes to hours.  This stated he took ibuprofen for his symptoms but did not seem to help.  Sometimes accompanied by shortness of breath is also worsened with taking a deep breath.  She is a current episode.  Began after waking this morning has been constant since onset.  Denies any fever, cough, Donnell pain, nausea, vomiting.  Denies any history of DVT/PE, recent surgery/immobilization, known malignancy, known coagulopathy.  Denies any family history/personal history of cardiac complications.  Patient does state that he feels like there is a knot on the left side of his chest that is tender.  Past medical history significant for diabetes, hypertension, hyperlipidemia, GERD  Home Medications Prior to Admission medications   Medication Sig Start Date End Date Taking? Authorizing Provider  ketorolac (TORADOL) 10 MG tablet Take 1 tablet (10 mg total) by mouth every 6 (six) hours as needed. 03/29/23  Yes Sherian Maroon A, PA  sertraline (ZOLOFT) 25 MG tablet Take 25 mg by mouth daily. 03/20/23  Yes [provider]  Accu-Chek Softclix Lancets lancets Use as instructed to check blood sugar twice daily. 04/05/22   Paseda, Baird Kay, FNP  amLODipine (NORVASC) 10 MG tablet Take 1 tablet (10 mg total) by mouth daily. 10/18/22   Donell Beers, FNP  aspirin EC 81 MG tablet Take 81 mg by mouth daily.    [provider]   atorvastatin (LIPITOR) 10 MG tablet Take 1 tablet by mouth once daily 10/06/22   Paseda, Phillips Grout R, FNP  Blood Glucose Monitoring Suppl (ACCU-CHEK GUIDE) w/Device KIT 1 each by Does not apply route in the morning and at bedtime. 04/08/22   Donell Beers, FNP  empagliflozin (JARDIANCE) 25 MG TABS tablet Take 1 tablet (25 mg total) by mouth daily before breakfast. 10/18/22   Paseda, Baird Kay, FNP  gabapentin (NEURONTIN) 100 MG capsule Take 1 capsule (100 mg total) by mouth 3 (three) times daily. 11/01/22   Paseda, Baird Kay, FNP  glipiZIDE (GLUCOTROL) 5 MG tablet Take 1 tablet (5 mg total) by mouth 2 (two) times daily before a meal. 10/18/22   Paseda, Phillips Grout R, FNP  glucose blood test strip Use as instructed to check blood sugar level twice a day 04/08/22   Donell Beers, FNP  hydrochlorothiazide (HYDRODIURIL) 25 MG tablet Take 1 tablet (25 mg total) by mouth daily. 10/18/22   Paseda, Baird Kay, FNP  levocetirizine (XYZAL) 5 MG tablet Take 1 tablet (5 mg total) by mouth every evening. 01/28/21   Orion Crook I, NP  metFORMIN (GLUCOPHAGE) 1000 MG tablet Take 1 tablet (1,000 mg total) by mouth 2 (two) times daily with a meal. 10/18/22   Paseda, Baird Kay, FNP  omeprazole (PRILOSEC) 40 MG capsule Take 40 mg by mouth daily. 09/29/22   [provider]  sildenafil (VIAGRA) 100 MG tablet Take 0.5-1 tablets (50-100 mg total) by mouth daily as needed for erectile dysfunction.  05/07/22   Donell Beers, FNP  tadalafil (CIALIS) 20 MG tablet Take 10 mg by mouth daily as needed for erectile dysfunction. 03/18/23   [provider]  valsartan (DIOVAN) 80 MG tablet Take 1 tablet (80 mg total) by mouth daily. 10/18/22   Paseda, Baird Kay, FNP  Vitamin D, Ergocalciferol, (DRISDOL) 1.25 MG (50000 UNIT) CAPS capsule Take 1 capsule (50,000 Units total) by mouth every 7 (seven) days. Patient not taking: Reported on 03/09/2022 01/28/21   Orion Crook I, NP      Allergies     Patient has no known allergies.    Review of Systems   Review of Systems  Cardiovascular:  Positive for chest pain.  All other systems reviewed and are negative.   Physical Exam Updated Vital Signs BP (!) 153/98   Pulse 74   Temp 98.9 F (37.2 C) (Oral)   Resp 17   Ht 6\' 7"  (2.007 m)   Wt 111.1 kg   SpO2 98%   BMI 27.60 kg/m  Physical Exam Vitals and nursing note reviewed.  Constitutional:      General: He is not in acute distress.    Appearance: He is well-developed.  HENT:     Head: Normocephalic and atraumatic.  Eyes:     Conjunctiva/sclera: Conjunctivae normal.  Cardiovascular:     Rate and Rhythm: Normal rate and regular rhythm.     Pulses: Normal pulses.     Heart sounds: No murmur heard.    No friction rub. No gallop.  Pulmonary:     Effort: Pulmonary effort is normal. No respiratory distress.     Breath sounds: Normal breath sounds. No wheezing, rhonchi or rales.     Comments: Left-sided chest wall tenderness to palpation. Chest:     Chest wall: Tenderness present.  Abdominal:     Palpations: Abdomen is soft.     Tenderness: There is no abdominal tenderness.  Musculoskeletal:        General: No swelling.     Cervical back: Neck supple.     Right lower leg: No edema.     Left lower leg: No edema.  Skin:    General: Skin is warm and dry.     Capillary Refill: Capillary refill takes less than 2 seconds.  Neurological:     Mental Status: He is alert.  Psychiatric:        Mood and Affect: Mood normal.     ED Results / Procedures / Treatments   Labs (all labs ordered are listed, but only abnormal results are displayed) Labs Reviewed  BASIC METABOLIC PANEL - Abnormal; Notable for the following components:      Result Value   Glucose, Bld 307 (*)    All other components within normal limits  CBC - Abnormal; Notable for the following components:   WBC 3.6 (*)    All other components within normal limits  D-DIMER, QUANTITATIVE  TROPONIN I (HIGH  SENSITIVITY)  TROPONIN I (HIGH SENSITIVITY)    EKG None  Radiology DG Chest 2 View Result Date: 03/29/2023 CLINICAL DATA:  Left-sided chest pain for several weeks. EXAM: CHEST - 2 VIEW COMPARISON:  08/31/2017 FINDINGS: The heart size and mediastinal contours are within normal limits. Both lungs are clear. The visualized skeletal structures are unremarkable. IMPRESSION: No active cardiopulmonary disease. Electronically Signed   By: Danae Orleans M.D.   On: 03/29/2023 13:25    Procedures Procedures    Medications Ordered in ED Medications  ketorolac (TORADOL)  15 MG/ML injection 15 mg (15 mg Intravenous Given 03/29/23 1543)    ED Course/ Medical Decision Making/ A&P Clinical Course as of 03/29/23 1800  Tue Mar 29, 2023  1444 DG Chest 2 View [CR]    Clinical Course User Index [CR] Peter Garter, [place]                                 Medical Decision Making Amount and/or Complexity of Data Reviewed Labs: ordered. Radiology: ordered. Decision-making details documented in ED Course.  Risk Prescription drug management.   This patient presents to the ED for concern of chest pain, this involves an extensive number of treatment options, and is a complaint that carries with it a high risk of complications and morbidity.  The differential diagnosis includes ACS, PE, pneumothorax, peritonitis/myocarditis/tamponade, pneumonia, CHF/COPD, other   Co morbidities that complicate the patient evaluation  See HPI   Additional history obtained:  Additional history obtained from EMR External records from outside source obtained and reviewed including hospital record   Lab Tests:  I Ordered, and personally interpreted labs.  The pertinent results include: Electrolyte minus.  No renal dysfunction.  Leukopenia of 3.6.  No evidence of anemia.  Placed within range.  Initial troponin 4 with repeat 4.  D-dimer negative.     Imaging Studies ordered:  I ordered imaging studies including  chest x-ray I independently visualized and interpreted imaging which showed no acute cardiopulmonary abnormalities I agree with the radiologist interpretation   Cardiac Monitoring: / EKG:  The patient was maintained on a cardiac monitor.  I personally viewed and interpreted the cardiac monitored which showed an underlying rhythm of: Sinus rhythm.  Nonspecific T wave changes lateral ischemia.  Consultations Obtained:  N/a   Problem List / ED Course / Critical interventions / Medication management  Left chest wall pain I ordered medication including Toradol  Reevaluation of the patient after these medicines showed that the patient improved I have reviewed the patients home medicines and have made adjustments as needed   Social Determinants of Health:  Denies tobacco, licit drug use.   Test / Admission - Considered:  Left chest wall pain Vitals signs significant for hypertension blood pressure 165/83. Otherwise within normal range and stable throughout visit. Laboratory/imaging studies significant for: See above 48 year old male presents emergency department with complaints of left-sided chest pain.  Present for the past couple of weeks.  Chest pain described as worsened with certain positional changes, lifting of heavy objects, exertion and sometimes accompanied with shortness of breath.  Workup today overall reassuring.  Chest x-ray without obvious pneumothorax, pneumonia, pulmonary vas congestion/pleural effusion or other abnormality.  Patient without the negative troponin, nonspecific T wave changes on EKG; low suspicion for ACS.  Patient with described pleuritic left-sided chest pain with negative D-dimer; low suspicion for PE.  Patient without chest pain rating to back, pulse deficits, neurologic deficits, hypertension; low suspicion for aortic dissection.  Patient with reproducible tenderness left-sided chest wall with improvement of symptoms after Toradol; suspicious for  musculoskeletal etiology of chest pain.  Will treat with NSAIDs in the outpatient setting and recommend follow-up with PCP.  Treatment plan discussed at length with patient and he acknowledged understanding was agreeable to said plan.  Patient overall well-appearing, afebrile in no acute distress. Worrisome signs and symptoms were discussed with the patient, and the patient acknowledged understanding to return to the ED if noticed. Patient was  stable upon discharge.          Final Clinical Impression(s) / ED Diagnoses Final diagnoses:  Chest wall pain    Rx / DC Orders      Peter Garter, PA 03/29/23 1800    Lonell Grandchild, MD 03/30/23 [phone]

## 2023-03-29 NOTE — ED Triage Notes (Signed)
Pt reports he has been having some left side chest pain x 2.5 weeks. States he has trouble breathing at times and feels as though there Is a knot in his chest.

## 2023-03-29 NOTE — ED Provider Notes (Signed)
[facility]    CSN: [id number] Arrival date & time: 03/29/23  1126      History   Chief Complaint No chief complaint on file.   HPI Zarif Yesenia Mantegna is a 48 y.o. male.   Patient presenting today with 2.5-week history of intermittent episodes of left-sided chest pain now worse today.  Does feel a knot on the left side of his chest and is having some pain and difficulty breathing at times.  Denies diaphoresis, nausea, vomiting, radiation of pain, history of MI.  Sober not trying thing over-the-counter for symptoms.  Past medical history significant for diabetes, hypertension, hyperlipidemia.   Past Medical History:  Diagnosis Date   Decreased libido 08/2019   Diabetes mellitus without complication (HCC)    Hyperlipidemia 05/2019   Hypertension    Vitamin D deficiency 05/2019    Patient Active Problem List   Diagnosis Date Noted   Cough due to ACE inhibitor 10/18/2022   Erectile dysfunction 04/06/2022   Diabetic neuropathy (HCC) 03/09/2022   Hyperlipidemia associated with type 2 diabetes mellitus (HCC) 03/09/2022   Hemoglobin A1C greater than 9%, indicating poor diabetic control 06/01/2019   Hyperglycemia 06/01/2019   SIRS (systemic inflammatory response syndrome) (HCC) 08/31/2017   Essential hypertension 08/31/2017   Type 2 diabetes mellitus with hyperglycemia (HCC) 08/31/2017    Past Surgical History:  Procedure Laterality Date   BACK SURGERY       Home Medications    Prior to Admission medications   Medication Sig Start Date End Date Taking? Authorizing Provider  Accu-Chek Softclix Lancets lancets Use as instructed to check blood sugar twice daily. 04/05/22   Paseda, Baird Kay, FNP  amLODipine (NORVASC) 10 MG tablet Take 1 tablet (10 mg total) by mouth daily. 10/18/22   Donell Beers, FNP  aspirin EC 81 MG tablet Take 81 mg by mouth daily.    [provider]  atorvastatin (LIPITOR) 10 MG tablet Take 1 tablet by mouth once daily  10/06/22   Paseda, Phillips Grout R, FNP  Blood Glucose Monitoring Suppl (ACCU-CHEK GUIDE) w/Device KIT 1 each by Does not apply route in the morning and at bedtime. 04/08/22   Donell Beers, FNP  empagliflozin (JARDIANCE) 25 MG TABS tablet Take 1 tablet (25 mg total) by mouth daily before breakfast. 10/18/22   Paseda, Baird Kay, FNP  gabapentin (NEURONTIN) 100 MG capsule Take 1 capsule (100 mg total) by mouth 3 (three) times daily. 11/01/22   Paseda, Baird Kay, FNP  glipiZIDE (GLUCOTROL) 5 MG tablet Take 1 tablet (5 mg total) by mouth 2 (two) times daily before a meal. 10/18/22   Paseda, Phillips Grout R, FNP  glucose blood test strip Use as instructed to check blood sugar level twice a day 04/08/22   Donell Beers, FNP  hydrochlorothiazide (HYDRODIURIL) 25 MG tablet Take 1 tablet (25 mg total) by mouth daily. 10/18/22   Donell Beers, FNP  ibuprofen (ADVIL) 800 MG tablet Take 1 tablet (800 mg total) by mouth every 8 (eight) hours as needed. Patient not taking: Reported on 04/06/2022 09/05/19   Kallie Locks, FNP  levocetirizine (XYZAL) 5 MG tablet Take 1 tablet (5 mg total) by mouth every evening. 01/28/21   Orion Crook I, NP  metFORMIN (GLUCOPHAGE) 1000 MG tablet Take 1 tablet (1,000 mg total) by mouth 2 (two) times daily with a meal. 10/18/22   Paseda, Baird Kay, FNP  omeprazole (PRILOSEC) 40 MG capsule Take 40 mg by mouth daily. 09/29/22   [provider]  sildenafil (VIAGRA) 100 MG tablet Take 0.5-1 tablets (50-100 mg total) by mouth daily as needed for erectile dysfunction. 05/07/22   Paseda, Baird Kay, FNP  valsartan (DIOVAN) 80 MG tablet Take 1 tablet (80 mg total) by mouth daily. 10/18/22   Paseda, Baird Kay, FNP  Vitamin D, Ergocalciferol, (DRISDOL) 1.25 MG (50000 UNIT) CAPS capsule Take 1 capsule (50,000 Units total) by mouth every 7 (seven) days. Patient not taking: Reported on 03/09/2022 01/28/21   Orion Crook I, NP    Family History Family History  Problem  Relation Age of Onset   Diabetes Mellitus II Mother    Diabetes Mellitus II Father    Stroke Father    Cancer - Colon Neg Hx     Social History Social History   Tobacco Use   Smoking status: Never   Smokeless tobacco: Never  Vaping Use   Vaping status: Never Used  Substance Use Topics   Alcohol use: Not Currently   Drug use: Never     Allergies   Patient has no known allergies.   Review of Systems Review of Systems PER HPI  Physical Exam Triage Vital Signs ED Triage Vitals  Encounter Vitals Group     BP 03/29/23 1132 (!) 176/83     Systolic BP Percentile --      Diastolic BP Percentile --      Pulse Rate 03/29/23 1132 83     Resp 03/29/23 1132 20     Temp 03/29/23 1132 98.2 F (36.8 C)     Temp Source 03/29/23 1132 Oral     SpO2 03/29/23 1132 95 %     Weight --      Height --      Head Circumference --      Peak Flow --      Pain Score 03/29/23 1134 4     Pain Loc --      Pain Education --      Exclude from Growth Chart --    No data found.  Updated Vital Signs BP (!) 176/83 (BP Location: Right Arm)   Pulse 83   Temp 98.2 F (36.8 C) (Oral)   Resp 20   SpO2 95%   Visual Acuity Right Eye Distance:   Left Eye Distance:   Bilateral Distance:    Right Eye Near:   Left Eye Near:    Bilateral Near:     Physical Exam Vitals and nursing note reviewed.  Constitutional:      Appearance: Normal appearance.  HENT:     Head: Atraumatic.     Mouth/Throat:     Mouth: Mucous membranes are moist.     Pharynx: Oropharynx is clear.  Eyes:     Extraocular Movements: Extraocular movements intact.     Conjunctiva/sclera: Conjunctivae normal.  Cardiovascular:     Rate and Rhythm: Normal rate and regular rhythm.  Pulmonary:     Effort: Pulmonary effort is normal.     Breath sounds: Normal breath sounds.     Comments: Localized tenderness to palpation to the left side of chest wall reproducing some of his pain Chest:     Chest wall: Tenderness present.   Musculoskeletal:        General: Normal range of motion.     Cervical back: Normal range of motion and neck supple.  Skin:    General: Skin is warm and dry.  Neurological:     General: No focal deficit present.  Mental Status: He is oriented to person, place, and time.     Motor: No weakness.     Gait: Gait normal.  Psychiatric:        Mood and Affect: Mood normal.        Thought Content: Thought content normal.        Judgment: Judgment normal.      UC Treatments / Results  Labs (all labs ordered are listed, but only abnormal results are displayed) Labs Reviewed - No data to display  EKG   Radiology No results found.  Procedures Procedures (including critical care time)  Medications Ordered in UC Medications - No data to display  Initial Impression / Assessment and Plan / UC Course  I have reviewed the triage vital signs and the nursing notes.  Pertinent labs & imaging results that were available during my care of the patient were reviewed by me and considered in my medical decision making (see chart for details).     Hypertensive in triage, otherwise vital signs within normal limits and he is well-appearing and in no acute distress.  Given his cardiac risk factors and nonspecific T wave changes on EKG today with no prior EKGs for comparison, did recommend emergency department for further evaluation and cardiac rule out.  Do suspect muscular nature of pain given reproducible on palpation and worse with movement but he is agreeable to going for further cardiac rule out for reassurance.  Final Clinical Impressions(s) / UC Diagnoses   Final diagnoses:  Chest pain, unspecified type  Elevated blood pressure reading  Nonspecific abnormal electrocardiogram (ECG) (EKG)   Discharge Instructions   None    ED Prescriptions   None    PDMP not reviewed this encounter.   Particia Nearing, [place] 03/29/23 1155

## 2023-03-29 NOTE — Discharge Instructions (Signed)
As discussed, workup today overall reassuring.  Your heart enzymes are normal does not appear like you are having a heart attack.  D-dimer is negative so do not think you have a blood clot in your lung.  Chest x-ray is without signs of pneumonia, collapsed lung or other abnormality.  Suspect more chest wall pain given reproducible tenderness pressing on your wall.  Will send you home with the same medicine we gave you on the emergency department for treatment of your pain.  Will recommend follow-up with dermatology in the outpatient setting for the left-sided chest wall mass for reassessment.  Attached is their number.  Please do not hesitate to return to the emergency department if the worrisome signs and symptoms we discussed become apparent.

## 2023-03-29 NOTE — ED Notes (Signed)
Patient is being discharged from the Urgent Care and sent to the Emergency Department via POV . Per provider, patient is in need of higher level of care due to chest pain. Patient is aware and verbalizes understanding of plan of care.  Vitals:   03/29/23 1132  BP: (!) 176/83  Pulse: 83  Resp: 20  Temp: 98.2 F (36.8 C)  SpO2: 95%

## 2023-03-29 NOTE — ED Triage Notes (Addendum)
Pt has had a tender knot on his left rib  x 3 weeks,  Pt reports it is tender to the touch and is exacerbated with movement of the torso.

## 2023-03-30 ENCOUNTER — Other Ambulatory Visit: Payer: Self-pay | Admitting: Nurse Practitioner

## 2023-03-30 DIAGNOSIS — E1165 Type 2 diabetes mellitus with hyperglycemia: Secondary | ICD-10-CM

## 2023-03-30 DIAGNOSIS — I10 Essential (primary) hypertension: Secondary | ICD-10-CM

## 2023-03-30 DIAGNOSIS — E1349 Other specified diabetes mellitus with other diabetic neurological complication: Secondary | ICD-10-CM

## 2023-03-30 DIAGNOSIS — E785 Hyperlipidemia, unspecified: Secondary | ICD-10-CM

## 2023-03-30 NOTE — Telephone Encounter (Signed)
Please advise and pt was called to advise of the need for an appointment. KH

## 2023-03-31 ENCOUNTER — Telehealth: Payer: Self-pay

## 2023-03-31 NOTE — Transitions of Care (Post Inpatient/ED Visit) (Cosign Needed)
03/31/2023  Name: Noah Bates MRN: [id number] DOB: [birth year]  Today's TOC FU Call Status: Today's TOC FU Call Status:: Successful TOC FU Call Completed TOC FU Call Complete Date: 03/29/23 Patient's Name and Date of Birth confirmed.  Transition Care Management Follow-up Telephone Call Date of Discharge: 03/29/23 [facility] Type of Discharge: Emergency Department Reason for ED Visit: Cardiac Conditions Cardiac Conditions Diagnosis: Chest Pain Persisting How have you been since you were released from the hospital?: Better Any questions or concerns?: No  Items Reviewed: Did you receive and understand the discharge instructions provided?: Yes Medications obtained,verified, and reconciled?: Yes (Medications Reviewed) Any new allergies since your discharge?: No Dietary orders reviewed?: No Do you have support at home?: Yes People in Home: child(ren), dependent, spouse  Medications Reviewed Today: Medications Reviewed Today     Reviewed by Renelda Loma, RMA (Registered Medical Assistant) on 03/31/23 at 1505  Med List Status: <None>   Medication Order Taking? Sig Documenting Provider Last Dose Status Informant  Accu-Chek Softclix Lancets lancets [id number] Yes Use as instructed to check blood sugar twice daily. Donell Beers, FNP Taking Active   amLODipine (NORVASC) 10 MG tablet [id number] Yes Take 1 tablet (10 mg total) by mouth daily. Donell Beers, FNP Taking Active   aspirin EC 81 MG tablet [id number] Yes Take 81 mg by mouth daily. [provider] Taking Active Self  atorvastatin (LIPITOR) 10 MG tablet [id number] Yes Take 1 tablet by mouth once daily Paseda, Baird Kay, FNP Taking Active   Blood Glucose Monitoring Suppl (ACCU-CHEK GUIDE) w/Device KIT [id number] Yes 1 each by Does not apply route in the morning and at bedtime. Donell Beers, FNP Taking Active   empagliflozin (JARDIANCE) 25 MG TABS tablet [id number] No Take 1  tablet (25 mg total) by mouth daily before breakfast.  Patient not taking: Reported on 03/31/2023   Donell Beers, FNP Not Taking Active   gabapentin (NEURONTIN) 100 MG capsule [id number] Yes TAKE 1 CAPSULE BY MOUTH THREE TIMES DAILY Paseda, Baird Kay, FNP Taking Active   glipiZIDE (GLUCOTROL) 5 MG tablet [id number] Yes Take 1 tablet (5 mg total) by mouth 2 (two) times daily before a meal. Paseda, Baird Kay, FNP Taking Active   glucose blood test strip [id number] Yes Use as instructed to check blood sugar level twice a day Paseda, Phillips Grout R, FNP Taking Active   hydrochlorothiazide (HYDRODIURIL) 25 MG tablet [id number] Yes Take 1 tablet (25 mg total) by mouth daily. Donell Beers, FNP Taking Active   ketorolac (TORADOL) 10 MG tablet [id number] Yes Take 1 tablet (10 mg total) by mouth every 6 (six) hours as needed. Peter Garter, PA Taking Active   levocetirizine (XYZAL) 5 MG tablet [id number] No Take 1 tablet (5 mg total) by mouth every evening.  Patient not taking: Reported on 03/31/2023   Orion Crook I, NP Not Taking Active   metFORMIN (GLUCOPHAGE) 1000 MG tablet [id number] Yes Take 1 tablet (1,000 mg total) by mouth 2 (two) times daily with a meal. Paseda, Baird Kay, FNP Taking Active   omeprazole (PRILOSEC) 40 MG capsule [id number] Yes Take 40 mg by mouth daily. [provider] Taking Active   sertraline (ZOLOFT) 25 MG tablet [id number] Yes Take 25 mg by mouth daily. [provider] Taking Active   sildenafil (VIAGRA) 100 MG tablet [id number] No Take 0.5-1 tablets (50-100 mg total) by mouth daily as needed for erectile dysfunction.  Patient not taking: Reported on 03/31/2023   Paseda,  Baird Kay, FNP Not Taking Active   tadalafil (CIALIS) 20 MG tablet [id number] Yes Take 10 mg by mouth daily as needed for erectile dysfunction. [provider] Taking Active   valsartan (DIOVAN) 80 MG tablet [id number] Yes Take 1 tablet (80 mg total) by mouth daily.  Donell Beers, FNP Taking Active   Vitamin D, Ergocalciferol, (DRISDOL) 1.25 MG (50000 UNIT) CAPS capsule [id number] No Take 1 capsule (50,000 Units total) by mouth every 7 (seven) days.  Patient not taking: Reported on 03/31/2023   Orion Crook I, NP Not Taking Active             Home Care and Equipment/Supplies: Were Home Health Services Ordered?: No Any new equipment or medical supplies ordered?: No  Functional Questionnaire: Do you need assistance with bathing/showering or dressing?: No Do you need assistance with meal preparation?: No Do you need assistance with eating?: No Do you have difficulty maintaining continence: No Do you need assistance with getting out of bed/getting out of a chair/moving?: No Do you have difficulty managing or taking your medications?: No  Follow up appointments reviewed: PCP Follow-up appointment confirmed?: Yes Date of PCP follow-up appointment?: 04/06/23 Follow-up Provider: [facility] Follow-up appointment confirmed?: NA Do you need transportation to your follow-up appointment?: No Do you understand care options if your condition(s) worsen?: Yes-patient verbalized understanding  SDOH Interventions Today    Flowsheet Row Most Recent Value  SDOH Interventions   Food Insecurity Interventions Intervention Not Indicated  Housing Interventions Intervention Not Indicated  Transportation Interventions Intervention Not Indicated  Utilities Interventions Intervention Not Indicated       SIGNATURE  Renelda Loma RMA

## 2023-04-06 ENCOUNTER — Inpatient Hospital Stay: Payer: Self-pay | Admitting: Nurse Practitioner

## 2023-04-27 ENCOUNTER — Other Ambulatory Visit: Payer: Self-pay | Admitting: Nurse Practitioner

## 2023-04-27 DIAGNOSIS — E1349 Other specified diabetes mellitus with other diabetic neurological complication: Secondary | ICD-10-CM

## 2023-04-27 MED ORDER — GABAPENTIN 100 MG PO CAPS: 100.0000 mg | ORAL_CAPSULE | Freq: Three times a day (TID) | ORAL | 0 refills | Status: DC

## 2023-04-27 NOTE — Telephone Encounter (Signed)
 Please advise [facility]

## 2023-05-09 ENCOUNTER — Other Ambulatory Visit: Payer: Self-pay | Admitting: Nurse Practitioner

## 2023-05-09 ENCOUNTER — Other Ambulatory Visit: Payer: Self-pay

## 2023-05-09 DIAGNOSIS — E785 Hyperlipidemia, unspecified: Secondary | ICD-10-CM

## 2023-05-09 DIAGNOSIS — E1349 Other specified diabetes mellitus with other diabetic neurological complication: Secondary | ICD-10-CM

## 2023-05-09 DIAGNOSIS — E1165 Type 2 diabetes mellitus with hyperglycemia: Secondary | ICD-10-CM

## 2023-05-09 MED ORDER — ATORVASTATIN CALCIUM 10 MG PO TABS: 10.0000 mg | ORAL_TABLET | Freq: Every day | ORAL | 0 refills | Status: DC

## 2023-05-09 MED ORDER — GABAPENTIN 100 MG PO CAPS
100.0000 mg | ORAL_CAPSULE | Freq: Three times a day (TID) | ORAL | 0 refills | Status: DC
Start: 2023-05-09 — End: 2023-06-27

## 2023-05-23 ENCOUNTER — Ambulatory Visit: Payer: Self-pay | Admitting: Nurse Practitioner

## 2023-05-23 ENCOUNTER — Encounter: Payer: Self-pay | Admitting: Nurse Practitioner

## 2023-05-23 VITALS — BP 142/74 | HR 77 | Temp 97.5°F | Wt 233.4 lb

## 2023-05-23 DIAGNOSIS — I10 Essential (primary) hypertension: Secondary | ICD-10-CM

## 2023-05-23 DIAGNOSIS — E1165 Type 2 diabetes mellitus with hyperglycemia: Secondary | ICD-10-CM

## 2023-05-23 DIAGNOSIS — E785 Hyperlipidemia, unspecified: Secondary | ICD-10-CM

## 2023-05-23 LAB — POCT GLYCOSYLATED HEMOGLOBIN (HGB A1C): Hemoglobin A1C: 11.9 % — AB (ref 4.0–5.6)

## 2023-05-23 MED ORDER — HYDROCHLOROTHIAZIDE 25 MG PO TABS: 25.0000 mg | ORAL_TABLET | Freq: Every day | ORAL | 3 refills | Status: DC

## 2023-05-23 MED ORDER — EMPAGLIFLOZIN 25 MG PO TABS: 25.0000 mg | ORAL_TABLET | Freq: Every day | ORAL | 1 refills | Status: DC

## 2023-05-23 MED ORDER — AMLODIPINE BESYLATE 10 MG PO TABS: 10.0000 mg | ORAL_TABLET | Freq: Every day | ORAL | 3 refills | Status: DC

## 2023-05-23 MED ORDER — METFORMIN HCL 1000 MG PO TABS: 1000.0000 mg | ORAL_TABLET | Freq: Two times a day (BID) | ORAL | 3 refills | Status: DC

## 2023-05-23 MED ORDER — ATORVASTATIN CALCIUM 10 MG PO TABS: 10.0000 mg | ORAL_TABLET | Freq: Every day | ORAL | 0 refills | Status: DC

## 2023-05-23 MED ORDER — GLIPIZIDE 5 MG PO TABS: 5.0000 mg | ORAL_TABLET | Freq: Two times a day (BID) | ORAL | 1 refills | Status: DC

## 2023-05-23 MED ORDER — VALSARTAN 80 MG PO TABS: 80.0000 mg | ORAL_TABLET | Freq: Every day | ORAL | 3 refills | Status: DC

## 2023-05-23 NOTE — Assessment & Plan Note (Signed)
 BP Readings from Last 3 Encounters:  05/23/23 (!) 142/74  03/29/23 (!) 153/98  03/29/23 (!) 176/83   HTN unControlled .  I doubt he has medication adherence Amlodipine 10 mg daily, valsartan 80 mg daily, hydrochlorothiazide 25 mg daily refilled Patient encouraged to monitor blood pressure at home, keep a log and bring to next visit, blood pressure goal is less than 130/80 Discussed DASH diet and dietary sodium restrictions Continue to increase dietary efforts and exercise.

## 2023-05-23 NOTE — Patient Instructions (Signed)
 Goal for fasting blood sugar ranges from 80 to 120 and 2 hours after any meal or at bedtime should be between 130 to 170.   Around 3 times per week, check your blood pressure 2 times per day. once in the morning and once in the evening. The readings should be at least one minute apart. Write down these values and bring them to your next nurse visit/appointment.  When you check your BP, make sure you have been doing something calm/relaxing 5 minutes prior to checking. Both feet should be flat on the floor and you should be sitting. Use your left arm and make sure it is in a relaxed position (on a table), and that the cuff is at the approximate level/height of your heart. Blood pressure goal is less than 130/80   1. Type 2 diabetes mellitus with hyperglycemia, without long-term current use of insulin (HCC) (Primary)  - POCT glycosylated hemoglobin (Hb A1C) - empagliflozin (JARDIANCE) 25 MG TABS tablet; Take 1 tablet (25 mg total) by mouth daily before breakfast.  Dispense: 90 tablet; Refill: 1 - glipiZIDE (GLUCOTROL) 5 MG tablet; Take 1 tablet (5 mg total) by mouth 2 (two) times daily before a meal.  Dispense: 180 tablet; Refill: 1 - metFORMIN (GLUCOPHAGE) 1000 MG tablet; Take 1 tablet (1,000 mg total) by mouth 2 (two) times daily with a meal.  Dispense: 180 tablet; Refill: 3 - AMB Referral VBCI Care Management - Ambulatory referral to diabetic education   Hyperlipidemia, unspecified hyperlipidemia type  - atorvastatin (LIPITOR) 10 MG tablet; Take 1 tablet (10 mg total) by mouth daily.  Dispense: 60 tablet; Refill: 0   Essential hypertension  - valsartan (DIOVAN) 80 MG tablet; Take 1 tablet (80 mg total) by mouth daily.  Dispense: 90 tablet; Refill: 3 - amLODipine (NORVASC) 10 MG tablet; Take 1 tablet (10 mg total) by mouth daily.  Dispense: 90 tablet; Refill: 3 - hydrochlorothiazide (HYDRODIURIL) 25 MG tablet; Take 1 tablet (25 mg total) by mouth daily.  Dispense: 90 tablet; Refill: 3

## 2023-05-23 NOTE — Assessment & Plan Note (Signed)
 Lab Results  Component Value Date   HGBA1C 11.9 (A) 05/23/2023  Chronic uncontrolled condition Restart Jardiance 25 mg daily, continue metformin 1000 mg twice daily, glipizide 5 mg twice daily CBG goals discussed Patient counseled on low-carb diet Encouraged to get his diabetes eye exam completed Checking urine microalbumin labs Need to get diabetes under control discussed Patient referred for nutrition education, referred to the clinical pharmacist to assist with medication adherence Follow-up in 4 weeks

## 2023-05-23 NOTE — Assessment & Plan Note (Signed)
 Lab Results  Component Value Date   CHOL 159 03/09/2022   HDL 47 03/09/2022   LDLCALC 99 03/09/2022   TRIG 63 03/09/2022   CHOLHDL 3.4 03/09/2022  Continue atorvastatin 10 mg daily Need to take medication daily as ordered discussed We will recheck labs in 4 weeks

## 2023-05-23 NOTE — Progress Notes (Signed)
 Established Patient Office Visit  Subjective:  Patient ID: Noah Bates, male    DOB: 1975-11-14  Age: 48 y.o. MRN: [id number]  CC: No chief complaint on file.   HPI Noah Bates is a 48 y.o. male  has a past medical history of Decreased libido (08/2019), Diabetes mellitus without complication (HCC), Hyperlipidemia (05/2019), Hypertension, and Vitamin D deficiency (05/2019).  Patient presents for follow-up for his chronic medical conditions  Hypertension.  Currently on amlodipine 10 mg daily, hydrochlorothiazide 25 mg daily, valsartan 80 mg daily.  No complaints of chest pain, shortness of breath, edema.  Uncontrolled type 2 diabetes.  Currently on metformin 1000 mg twice daily, glipizide 5 mg twice daily, he has stopped taking Jardiance due to burping, we discussed starting a GLP-1 but he would like to retry Jardiance. He has been taking atorvastatin 10 mg tablet inconsistently.  Due for diabetic eye exam.  No complaints of polyuria polydipsia polyphagia   Past Medical History:  Diagnosis Date   Decreased libido 08/2019   Diabetes mellitus without complication (HCC)    Hyperlipidemia 05/2019   Hypertension    Vitamin D deficiency 05/2019    Past Surgical History:  Procedure Laterality Date   BACK SURGERY      Family History  Problem Relation Age of Onset   Diabetes Mellitus II Mother    Diabetes Mellitus II Father    Stroke Father    Cancer - Colon Neg Hx     Social History   Socioeconomic History   Marital status: Married    Spouse name: Not on file   Number of children: 4   Years of education: Not on file   Highest education level: Not on file  Occupational History   Not on file  Tobacco Use   Smoking status: Never   Smokeless tobacco: Never  Vaping Use   Vaping status: Never Used  Substance and Sexual Activity   Alcohol use: Not Currently   Drug use: Never   Sexual activity: Yes  Other Topics Concern   Not on file  Social History  Narrative   Lives with wife.    Social Drivers of Corporate investment banker Strain: Not on file  Food Insecurity: No Food Insecurity (03/31/2023)   Hunger Vital Sign    Worried About Running Out of Food in the Last Year: Never true    Ran Out of Food in the Last Year: Never true  Transportation Needs: No Transportation Needs (03/31/2023)   PRAPARE - Administrator, Civil Service (Medical): No    Lack of Transportation (Non-Medical): No  Physical Activity: Not on file  Stress: Not on file  Social Connections: Not on file  Intimate Partner Violence: Not At Risk (03/31/2023)   Humiliation, Afraid, Rape, and Kick questionnaire    Fear of Current or Ex-Partner: No    Emotionally Abused: No    Physically Abused: No    Sexually Abused: No    Outpatient Medications Prior to Visit  Medication Sig Dispense Refill   Accu-Chek Softclix Lancets lancets Use as instructed to check blood sugar twice daily. 100 each 12   aspirin EC 81 MG tablet Take 81 mg by mouth daily.     Blood Glucose Monitoring Suppl (ACCU-CHEK GUIDE) w/Device KIT 1 each by Does not apply route in the morning and at bedtime. 1 kit 0   gabapentin (NEURONTIN) 100 MG capsule Take 1 capsule (100 mg total) by mouth 3 (three) times daily.  90 capsule 0   omeprazole (PRILOSEC) 40 MG capsule Take 40 mg by mouth daily.     sertraline (ZOLOFT) 25 MG tablet Take 25 mg by mouth daily.     amLODipine (NORVASC) 10 MG tablet Take 1 tablet (10 mg total) by mouth daily. 90 tablet 3   atorvastatin (LIPITOR) 10 MG tablet Take 1 tablet (10 mg total) by mouth daily. 30 tablet 0   glipiZIDE (GLUCOTROL) 5 MG tablet Take 1 tablet (5 mg total) by mouth 2 (two) times daily before a meal. 60 tablet 3   hydrochlorothiazide (HYDRODIURIL) 25 MG tablet Take 1 tablet (25 mg total) by mouth daily. 90 tablet 3   metFORMIN (GLUCOPHAGE) 1000 MG tablet Take 1 tablet (1,000 mg total) by mouth 2 (two) times daily with a meal. 180 tablet 3   valsartan  (DIOVAN) 80 MG tablet Take 1 tablet (80 mg total) by mouth daily. 90 tablet 3   glucose blood test strip Use as instructed to check blood sugar level twice a day 200 each 12   ketorolac (TORADOL) 10 MG tablet Take 1 tablet (10 mg total) by mouth every 6 (six) hours as needed. (Patient not taking: Reported on 05/23/2023) 20 tablet 0   levocetirizine (XYZAL) 5 MG tablet Take 1 tablet (5 mg total) by mouth every evening. (Patient not taking: Reported on 05/23/2023) 30 tablet 11   sildenafil (VIAGRA) 100 MG tablet Take 0.5-1 tablets (50-100 mg total) by mouth daily as needed for erectile dysfunction. (Patient not taking: Reported on 05/23/2023) 5 tablet 11   tadalafil (CIALIS) 20 MG tablet Take 10 mg by mouth daily as needed for erectile dysfunction. (Patient not taking: Reported on 05/23/2023)     Vitamin D, Ergocalciferol, (DRISDOL) 1.25 MG (50000 UNIT) CAPS capsule Take 1 capsule (50,000 Units total) by mouth every 7 (seven) days. (Patient not taking: Reported on 03/09/2022) 5 capsule 2   empagliflozin (JARDIANCE) 25 MG TABS tablet Take 1 tablet (25 mg total) by mouth daily before breakfast. (Patient not taking: Reported on 05/23/2023) 90 tablet 1   No facility-administered medications prior to visit.    No Known Allergies  ROS Review of Systems  Constitutional:  Negative for appetite change, chills, fatigue and fever.  HENT:  Negative for congestion, postnasal drip, rhinorrhea and sneezing.   Respiratory:  Negative for cough, shortness of breath and wheezing.   Cardiovascular:  Negative for chest pain, palpitations and leg swelling.  Gastrointestinal:  Negative for abdominal pain, constipation, nausea and vomiting.  Genitourinary:  Negative for difficulty urinating, dysuria, flank pain and frequency.  Musculoskeletal:  Negative for arthralgias, back pain, joint swelling and myalgias.  Skin:  Negative for color change, pallor, rash and wound.  Neurological:  Negative for dizziness, facial asymmetry,  weakness, numbness and headaches.  Psychiatric/Behavioral:  Negative for behavioral problems, confusion, self-injury and suicidal ideas.       Objective:    Physical Exam Vitals and nursing note reviewed.  Constitutional:      General: He is not in acute distress.    Appearance: Normal appearance. He is not ill-appearing, toxic-appearing or diaphoretic.  HENT:     Mouth/Throat:     Mouth: Mucous membranes are moist.     Pharynx: Oropharynx is clear. No oropharyngeal exudate or posterior oropharyngeal erythema.  Eyes:     General: No scleral icterus.       Right eye: No discharge.        Left eye: No discharge.     Extraocular  Movements: Extraocular movements intact.     Conjunctiva/sclera: Conjunctivae normal.  Cardiovascular:     Rate and Rhythm: Normal rate and regular rhythm.     Pulses: Normal pulses.     Heart sounds: Normal heart sounds. No murmur heard.    No friction rub. No gallop.  Pulmonary:     Effort: Pulmonary effort is normal. No respiratory distress.     Breath sounds: Normal breath sounds. No stridor. No wheezing, rhonchi or rales.  Chest:     Chest wall: No tenderness.  Abdominal:     General: There is no distension.     Palpations: Abdomen is soft.     Tenderness: There is no abdominal tenderness. There is no right CVA tenderness, left CVA tenderness or guarding.  Musculoskeletal:        General: No swelling, tenderness, deformity or signs of injury.     Right lower leg: No edema.     Left lower leg: No edema.  Skin:    General: Skin is warm and dry.     Capillary Refill: Capillary refill takes less than 2 seconds.     Coloration: Skin is not jaundiced or pale.     Findings: No bruising, erythema or lesion.  Neurological:     Mental Status: He is alert and oriented to person, place, and time.     Motor: No weakness.     Coordination: Coordination normal.     Gait: Gait normal.  Psychiatric:        Mood and Affect: Mood normal.        Behavior:  Behavior normal.        Thought Content: Thought content normal.        Judgment: Judgment normal.     BP (!) 142/74   Pulse 77   Temp (!) 97.5 F (36.4 C) (Oral)   Wt 233 lb 6.4 oz (105.9 kg)   SpO2 100%   BMI 26.29 kg/m  Wt Readings from Last 3 Encounters:  05/23/23 233 lb 6.4 oz (105.9 kg)  03/29/23 245 lb (111.1 kg)  10/18/22 234 lb 6.4 oz (106.3 kg)    Lab Results  Component Value Date   TSH 2.060 01/28/2021   Lab Results  Component Value Date   WBC 3.6 (L) 03/29/2023   HGB 14.5 03/29/2023   HCT 43.5 03/29/2023   MCV 80.9 03/29/2023   PLT 165 03/29/2023   Lab Results  Component Value Date   NA 138 03/29/2023   K 4.0 03/29/2023   CO2 28 03/29/2023   GLUCOSE 307 (H) 03/29/2023   BUN 13 03/29/2023   CREATININE 0.86 03/29/2023   BILITOT 0.8 10/18/2022   ALKPHOS 63 10/18/2022   AST 13 10/18/2022   ALT 23 10/18/2022   PROT 6.9 10/18/2022   ALBUMIN 4.7 10/18/2022   CALCIUM 9.3 03/29/2023   ANIONGAP 9 03/29/2023   EGFR 78 10/18/2022   Lab Results  Component Value Date   CHOL 159 03/09/2022   Lab Results  Component Value Date   HDL 47 03/09/2022   Lab Results  Component Value Date   LDLCALC 99 03/09/2022   Lab Results  Component Value Date   TRIG 63 03/09/2022   Lab Results  Component Value Date   CHOLHDL 3.4 03/09/2022   Lab Results  Component Value Date   HGBA1C 11.9 (A) 05/23/2023      Assessment & Plan:   Problem List Items Addressed This Visit       Cardiovascular  and Mediastinum   Essential hypertension   BP Readings from Last 3 Encounters:  05/23/23 (!) 142/74  03/29/23 (!) 153/98  03/29/23 (!) 176/83   HTN unControlled .  I doubt he has medication adherence Amlodipine 10 mg daily, valsartan 80 mg daily, hydrochlorothiazide 25 mg daily refilled Patient encouraged to monitor blood pressure at home, keep a log and bring to next visit, blood pressure goal is less than 130/80 Discussed DASH diet and dietary sodium  restrictions Continue to increase dietary efforts and exercise.         Relevant Medications   atorvastatin (LIPITOR) 10 MG tablet   valsartan (DIOVAN) 80 MG tablet   amLODipine (NORVASC) 10 MG tablet   hydrochlorothiazide (HYDRODIURIL) 25 MG tablet     Endocrine   Type 2 diabetes mellitus with hyperglycemia (HCC) - Primary   Lab Results  Component Value Date   HGBA1C 11.9 (A) 05/23/2023  Chronic uncontrolled condition Restart Jardiance 25 mg daily, continue metformin 1000 mg twice daily, glipizide 5 mg twice daily CBG goals discussed Patient counseled on low-carb diet Encouraged to get his diabetes eye exam completed Checking urine microalbumin labs Need to get diabetes under control discussed Patient referred for nutrition education, referred to the clinical pharmacist to assist with medication adherence Follow-up in 4 weeks      Relevant Medications   atorvastatin (LIPITOR) 10 MG tablet   empagliflozin (JARDIANCE) 25 MG TABS tablet   glipiZIDE (GLUCOTROL) 5 MG tablet   metFORMIN (GLUCOPHAGE) 1000 MG tablet   valsartan (DIOVAN) 80 MG tablet   Other Relevant Orders   POCT glycosylated hemoglobin (Hb A1C) (Completed)   AMB Referral VBCI Care Management   Ambulatory referral to diabetic education   Microalbumin / creatinine urine ratio   Other Visit Diagnoses       Hyperlipidemia, unspecified hyperlipidemia type       Relevant Medications   atorvastatin (LIPITOR) 10 MG tablet   valsartan (DIOVAN) 80 MG tablet   amLODipine (NORVASC) 10 MG tablet   hydrochlorothiazide (HYDRODIURIL) 25 MG tablet       Meds ordered this encounter  Medications   atorvastatin (LIPITOR) 10 MG tablet    Sig: Take 1 tablet (10 mg total) by mouth daily.    Dispense:  60 tablet    Refill:  0   empagliflozin (JARDIANCE) 25 MG TABS tablet    Sig: Take 1 tablet (25 mg total) by mouth daily before breakfast.    Dispense:  90 tablet    Refill:  1   glipiZIDE (GLUCOTROL) 5 MG tablet     Sig: Take 1 tablet (5 mg total) by mouth 2 (two) times daily before a meal.    Dispense:  180 tablet    Refill:  1   metFORMIN (GLUCOPHAGE) 1000 MG tablet    Sig: Take 1 tablet (1,000 mg total) by mouth 2 (two) times daily with a meal.    Dispense:  180 tablet    Refill:  3   valsartan (DIOVAN) 80 MG tablet    Sig: Take 1 tablet (80 mg total) by mouth daily.    Dispense:  90 tablet    Refill:  3   amLODipine (NORVASC) 10 MG tablet    Sig: Take 1 tablet (10 mg total) by mouth daily.    Dispense:  90 tablet    Refill:  3   hydrochlorothiazide (HYDRODIURIL) 25 MG tablet    Sig: Take 1 tablet (25 mg total) by mouth daily.  Dispense:  90 tablet    Refill:  3    Follow-up: Return in about 4 weeks (around 06/20/2023) for HTN, HYPERLIPIDEMIA.    Donell Beers, FNP

## 2023-05-24 ENCOUNTER — Telehealth: Payer: Self-pay | Admitting: *Deleted

## 2023-05-24 ENCOUNTER — Other Ambulatory Visit: Payer: Self-pay

## 2023-05-24 DIAGNOSIS — E1165 Type 2 diabetes mellitus with hyperglycemia: Secondary | ICD-10-CM

## 2023-05-24 NOTE — Progress Notes (Signed)
 Care Guide Pharmacy Note  05/24/2023 Name: Noah Bates MRN: [id number] DOB: 1975/03/28  Referred By: Donell Beers, FNP Reason for referral: No chief complaint on file.   Noah Bates is a 48 y.o. year old male who is a primary care patient of Donell Beers, FNP.  Noah Bates was referred to the pharmacist for assistance related to: DMII  An unsuccessful telephone outreach was attempted today to contact the patient who was referred to the pharmacy team for assistance with medication management. Additional attempts will be made to contact the patient.  Gwenevere Ghazi  [facility]  [facility], [facility] Guide  Direct Dial: [phone]  Fax [phone]

## 2023-05-25 ENCOUNTER — Other Ambulatory Visit: Payer: Self-pay

## 2023-05-25 LAB — MICROALBUMIN / CREATININE URINE RATIO
Creatinine, Urine: 159.7 mg/dL
Microalb/Creat Ratio: 17 mg/g{creat} (ref 0–29)
Microalbumin, Urine: 27.4 ug/mL

## 2023-05-29 ENCOUNTER — Other Ambulatory Visit: Payer: Self-pay | Admitting: Nurse Practitioner

## 2023-05-29 DIAGNOSIS — E1169 Type 2 diabetes mellitus with other specified complication: Secondary | ICD-10-CM

## 2023-06-03 NOTE — Progress Notes (Signed)
 Complex Care Management Note Care Guide Note  06/03/2023 Name: Noah Bates MRN: [id number] DOB: 1975-12-12   Complex Care Management Outreach Attempts: A second unsuccessful outreach was attempted today to offer the patient with information about available complex care management services.  Follow Up Plan:  Additional outreach attempts will be made to offer the patient complex care management information and services.   Encounter Outcome:  No Answer  Gwenevere Ghazi  [facility]  [facility], [facility] Guide  Direct Dial: [phone]  Fax [phone]

## 2023-06-03 NOTE — Progress Notes (Signed)
 Care Guide Pharmacy Note  06/03/2023 Name: Noah Bates MRN: [id number] DOB: 1976-01-01  Referred By: Donell Beers, FNP Reason for referral: Care Coordination (Initial outreach schedule referral with PharmD )   Noah Bates is a 48 y.o. year old male who is a primary care patient of Donell Beers, FNP.  Noah Bates was referred to the pharmacist for assistance related to: DMII for diease management and medication adherence.   Successful contact was made with the patient to discuss pharmacy services including being ready for the pharmacist to call at least 5 minutes before the scheduled appointment time and to have medication bottles and any blood pressure readings ready for review. The patient agreed to meet with the pharmacist via telephone visit on (date/time).06/14/23  [facility]  [facility], [facility] Guide  Direct Dial: [phone]  Fax [phone]

## 2023-06-14 ENCOUNTER — Other Ambulatory Visit: Payer: Self-pay

## 2023-06-14 DIAGNOSIS — E1165 Type 2 diabetes mellitus with hyperglycemia: Secondary | ICD-10-CM

## 2023-06-14 DIAGNOSIS — R739 Hyperglycemia, unspecified: Secondary | ICD-10-CM

## 2023-06-14 DIAGNOSIS — I10 Essential (primary) hypertension: Secondary | ICD-10-CM

## 2023-06-14 NOTE — Progress Notes (Signed)
 06/14/2023 Name: Noah Bates MRN: [id number] DOB: Jun 26, 1975  Chief Complaint  Patient presents with   Diabetes   Hypertension   Medication Adherence    Noah Bates is a 48 y.o. year old male who presented for a telephone visit.   They were referred to the pharmacist by their PCP for assistance in managing diabetes and medication adherence .    Subjective:  Care Team: Primary Care Provider: Paseda, Folashade R, FNP ; Next Scheduled Visit: 07/01/2023   Medication Access/Adherence  Current Pharmacy:  [id number] - [place] ([place]), [place] - [address] [place] ([place]) [place] [address] Phone: [phone] Fax: [phone]   Patient reports affordability concerns with their medications: No  Patient reports access/transportation concerns to their pharmacy: No  Patient reports adherence concerns with their medications:  Yes  Sometimes forget the evening doses (glipizide and metformin) - using a pillbox to organize and wife reminds him. Discussed using an evening alarm to take medications.   Diabetes:  Current medications: Jardiance 25 mg daily (restarted), metformin 1000 mg twice daily, glipizide 5 mg twice daily   Current glucose readings(only has the last 4 days available):  4/4: 140 (fasting), 168 (prior to dinner)  4/5: 205 (fasting), 272 (after dinner) 4/6: 272 (after dinner) 4/7: skip 4/8: 302    Using AccuChek Guide meter; testing twice times daily   Patient denies hypoglycemic s/sx including dizziness, shakiness, sweating. Patient denies hyperglycemic symptoms including polyuria, polydipsia, polyphagia, nocturia, neuropathy, blurred vision.  Current meal patterns:  - He plans to resume a vegan diet and will start in early May. He will try to slowly start easing into a vegan diet with eventually trying to be a pescatarian.  -He enjoys rice and pasta  -He drinks mostly water and diet beverage   Current  physical activity: Not at this time, but plans to soon by walking.    Hypertension:  Current medications: amlodipine 10 mg daily, valsartan 80 mg daily, hydrochlorothiazide 25 mg daily   Patient has a validated, automated, upper arm home BP cuff Current blood pressure readings readings: He does not check blood pressure   Patient denies hypotensive s/sx including dizziness, lightheadedness.  Patient denies hypertensive symptoms including headache, chest pain, shortness of breath    Hyperlipidemia/ASCVD Risk Reduction  Current lipid lowering medications: atorvastatin 10 mg daily; aspirin 81 mg daily   Objective:  Lab Results  Component Value Date   HGBA1C 11.9 (A) 05/23/2023    Lab Results  Component Value Date   CREATININE 0.86 03/29/2023   BUN 13 03/29/2023   NA 138 03/29/2023   K 4.0 03/29/2023   CL 101 03/29/2023   CO2 28 03/29/2023    Lab Results  Component Value Date   CHOL 159 03/09/2022   HDL 47 03/09/2022   LDLCALC 99 03/09/2022   TRIG 63 03/09/2022   CHOLHDL 3.4 03/09/2022    Medications Reviewed Today     Reviewed by Amedeo Bailiff, RPH (Pharmacist) on 06/14/23 at 1241  Med List Status: <None>   Medication Order Taking? Sig Documenting Provider Last Dose Status Informant  Accu-Chek Softclix Lancets lancets [id number] Yes Use as instructed to check blood sugar twice daily. Paseda, Folashade R, FNP Taking Active   amLODipine (NORVASC) 10 MG tablet [id number] Yes Take 1 tablet (10 mg total) by mouth daily. Paseda, Folashade R, FNP Taking Active   aspirin EC 81 MG tablet [id number] Yes Take 81 mg by mouth daily. [provider] Taking Active Self  atorvastatin (LIPITOR) 10 MG tablet [id number] Yes Take 1 tablet (10 mg total) by mouth daily. Paseda, Folashade R, FNP Taking Active   Blood Glucose Monitoring Suppl (ACCU-CHEK GUIDE) w/Device KIT [id number] Yes 1 each by Does not apply route in the morning and at bedtime. Paseda, Folashade R, FNP Taking  Active   empagliflozin (JARDIANCE) 25 MG TABS tablet [id number] No Take 1 tablet (25 mg total) by mouth daily before breakfast.  Patient not taking: Reported on 06/14/2023   Paseda, Folashade R, FNP Not Taking Active            Med Note Vallarie Gauze, [facility] R   Tue Jun 14, 2023 12:35 PM) Needs to refill medication as he has not started the medication again  gabapentin (NEURONTIN) 100 MG capsule [id number] Yes Take 1 capsule (100 mg total) by mouth 3 (three) times daily. Paseda, Folashade R, FNP Taking Active   glipiZIDE (GLUCOTROL) 5 MG tablet [id number] Yes Take 1 tablet (5 mg total) by mouth 2 (two) times daily before a meal. Paseda, Folashade R, FNP Taking Active   glucose blood test strip [id number] Yes Use as instructed to check blood sugar level twice a day Paseda, Folashade R, FNP Taking Active   hydrochlorothiazide (HYDRODIURIL) 25 MG tablet [id number] Yes Take 1 tablet (25 mg total) by mouth daily. Paseda, Folashade R, FNP Taking Active   ketorolac (TORADOL) 10 MG tablet [id number] No Take 1 tablet (10 mg total) by mouth every 6 (six) hours as needed.  Patient not taking: Reported on 06/14/2023   [place] Butter, PA Not Taking Active   levocetirizine (XYZAL) 5 MG tablet [id number] No Take 1 tablet (5 mg total) by mouth every evening.  Patient not taking: Reported on 03/31/2023   Filbert Huff I, NP Not Taking Active   metFORMIN (GLUCOPHAGE) 1000 MG tablet [id number] Yes Take 1 tablet (1,000 mg total) by mouth 2 (two) times daily with a meal. Paseda, Folashade R, FNP Taking Active   Multiple Vitamin (MULTIVITAMIN) tablet [id number] Yes Take 1 tablet by mouth daily. [provider] Taking Active   omeprazole (PRILOSEC) 40 MG capsule [id number] Yes Take 40 mg by mouth daily. [provider] Taking Active   sertraline (ZOLOFT) 25 MG tablet [id number] Yes Take 25 mg by mouth daily. [provider] Taking Active   tadalafil (CIALIS) 20 MG tablet [id number] Yes Take 10 mg by mouth  daily as needed for erectile dysfunction. [provider] Taking Active   valsartan (DIOVAN) 80 MG tablet [id number] Yes Take 1 tablet (80 mg total) by mouth daily. Paseda, Folashade R, FNP Taking Active   Vitamin D, Ergocalciferol, (DRISDOL) 1.25 MG (50000 UNIT) CAPS capsule [id number] No Take 1 capsule (50,000 Units total) by mouth every 7 (seven) days.  Patient not taking: Reported on 06/14/2023   Filbert Huff I, NP Not Taking Active               Assessment/Plan:   Diabetes: - Currently uncontrolled due to medication adherence. He recently restarted Jardiance and states that he has been taking his medications, but forgets to take the evening dose of metformin and glipizide. We discussed using a phone alarm to remind him to take medication.  - Reviewed long term cardiovascular and renal outcomes of uncontrolled blood sugar - Reviewed goal A1c, goal fasting, and goal 2 hour post prandial glucose - Reviewed dietary modifications: discussed being mindful of carbohydrate load when transitioning. He is open to a dietician. He did have  a referral and appointment placed but the provider canceled the appointment.  - Reviewed lifestyle modifications including: encouraged to being moderate exercise 150 minutes per week - Recommend to continue current regimen as patient recently restarted Jardiance. Future consideration for insulin or GLP1 agonist or GIP/GLP1 agonist medication to help lower hemoglobin A1c at the next appointment with PCP. Will need to also consider need to discontinue glipizide if medications are started given his current medication regimen.  - Recommend to check glucose twice daily (fasting and prandial). - Will follow up for appointment with dietician.     Hypertension: - Currently uncontrolled likely due to adherence and discussed the importance of medication compliance.  - Reviewed long term cardiovascular and renal outcomes of uncontrolled blood pressure -  Reviewed appropriate blood pressure monitoring technique and reviewed goal blood pressure. Recommended to check home blood pressure and heart rate once daily. Patient is encouraged to bring device to clinic.  - Recommend to continue current regimen for now.      Hyperlipidemia/ASCVD Risk Reduction: - Currently controlled when last checked January 2024.  - Reviewed long term complications of uncontrolled cholesterol - Recommend to continue current regimen. He is due for an updated lipid panel and will defer for PCP at the next appointment.  - Continue current regimen.     Follow Up Plan: PCP appointment in 2 weeks; PharmD phone appointment  in 4 weeks   Alexandria Angel, PharmD Clinical Pharmacist Cell: [phone]

## 2023-06-21 MED ORDER — ACCU-CHEK SOFTCLIX LANCETS MISC: 12 refills | Status: AC

## 2023-06-21 MED ORDER — GLUCOSE BLOOD VI STRP: ORAL_STRIP | 12 refills | Status: DC

## 2023-06-21 NOTE — Patient Instructions (Signed)
 Noah Bates,   Check your blood sugars twice daily:  1) Fasting, first thing in the morning before breakfast and  2) 2 hours after your largest meal.   For a goal A1c of less than 7%, goal fasting readings are less than 130 and goal 2 hour after meal readings are less than 180.    Check your blood pressure once daily, and any time you have concerning symptoms like headache, chest pain, dizziness, shortness of breath, or vision changes.   Our goal is less than 130/80.  To appropriately check your blood pressure, make sure you do the following:  1) Avoid caffeine, exercise, or tobacco products for 30 minutes before checking. Empty your bladder. 2) Sit with your back supported in a flat-backed chair. Rest your arm on something flat (arm of the chair, table, etc). 3) Sit still with your feet flat on the floor, resting, for at least 5 minutes.  4) Check your blood pressure. Take 1-2 readings.  5) Write down these readings and bring with you to any provider appointments.  Bring your home blood pressure machine with you to a provider's office for accuracy comparison at least once a year.   Make sure you take your blood pressure medications before you come to any office visit, even if you were asked to fast for labs.   Alexandria Angel, PharmD Clinical Pharmacist Cell: [phone]

## 2023-06-22 ENCOUNTER — Other Ambulatory Visit: Payer: Self-pay | Admitting: Nurse Practitioner

## 2023-06-22 DIAGNOSIS — E1165 Type 2 diabetes mellitus with hyperglycemia: Secondary | ICD-10-CM

## 2023-06-22 MED ORDER — ACCU-CHEK GUIDE TEST VI STRP: ORAL_STRIP | 12 refills | Status: DC

## 2023-06-22 NOTE — Progress Notes (Signed)
 Pt was given number  to dm nutrition. KH

## 2023-06-22 NOTE — Telephone Encounter (Signed)
 This RN spoke to pt, he advised he does have a machine but the incorrect test strips were sent to pharmacy. Pt needs Accucheck Guide test strips sent to pharmacy.   Copied from CRM [id number]. Topic: Clinical - Medication Question >> Jun 22, 2023  8:55 AM Dominic Friendly wrote: [facility] pharmacy Jearlean Mince is calling to get clarity on what they are suppose to give the patient. There are lance test strips and no machine.   [id number] - [place] ([place]), [place] - [address] [place])  [address] Phone: [phone] Fax: [phone] Hours: Not open 24 hours

## 2023-06-25 ENCOUNTER — Other Ambulatory Visit: Payer: Self-pay | Admitting: Nurse Practitioner

## 2023-06-25 DIAGNOSIS — E1349 Other specified diabetes mellitus with other diabetic neurological complication: Secondary | ICD-10-CM

## 2023-07-01 ENCOUNTER — Ambulatory Visit: Payer: Self-pay | Admitting: Nurse Practitioner

## 2023-07-12 ENCOUNTER — Other Ambulatory Visit: Payer: Self-pay

## 2023-07-12 DIAGNOSIS — Z79899 Other long term (current) drug therapy: Secondary | ICD-10-CM

## 2023-07-12 DIAGNOSIS — E785 Hyperlipidemia, unspecified: Secondary | ICD-10-CM

## 2023-07-12 DIAGNOSIS — E1165 Type 2 diabetes mellitus with hyperglycemia: Secondary | ICD-10-CM

## 2023-07-12 DIAGNOSIS — I10 Essential (primary) hypertension: Secondary | ICD-10-CM

## 2023-07-12 NOTE — Progress Notes (Signed)
 07/12/2023 Name: Noah Bates MRN: [id number] DOB: 1975-12-09  No chief complaint on file.   Noah Bates is a 48 y.o. year old male who presented for a telephone visit.   They were referred to the pharmacist by their PCP for assistance in managing diabetes and medication adherence.    Subjective:  Care Team: Primary Care Provider: Paseda, Folashade R, FNP ; Next Scheduled Visit: 07/29/2023 Stevan Eke, RD; Next appointment: 08/15/2023  Medication Access/Adherence  Current Pharmacy:  [id number] - [place] ([place] - [address] [place] ([place]) [place] [address] Phone: [phone] Fax: [phone]   Patient reports affordability concerns with their medications: No  Patient reports access/transportation concerns to their pharmacy: No  Patient reports adherence concerns with their medications:  No     Diabetes:  Current medications: Jardiance  25 mg daily, metformin  1000 mg twice daily, glipizide  5 mg twice daily   Current glucose readings: last checked blood glucose last week  --- Fasting blood: 150 --- No post prandial blood glucose  --- He has not synced results to phone - encouraged patient to sync blood glucose results prior to appointment with PCP  Using BGM  meter; testing inconsistently prior to running out    Patient denies hypoglycemic s/sx including dizziness, shakiness, sweating. Patient denies hyperglycemic symptoms including polyuria, polydipsia, polyphagia, nocturia, neuropathy, blurred vision.  Current meal patterns: Have not started transitioning to becoming a vegan. He states that he has started to reduce eating pasta, rice, breads   Current physical activity: No physical activity at this time    Hypertension:  Current medications: hydrochlorothiazide  25 mg daily, amlodipine  10 mg daily, valsartan  80 mg daily   Patient has a validated, automated, upper arm home BP cuff.  However, hedoes not checked his blood pressure often. He reports checking his blood pressure once or twice weekly. He was unable to report actual blood pressure range  Current blood pressure readings readings: "medium range but I do not recall the numbers"    Hyperlipidemia/ASCVD Risk Reduction  Current lipid lowering medications: atorvastatin  10 mg daily  Antiplatelet regimen: Aspirin 81 mg daily   The 10-year ASCVD risk score (Arnett DK, et al., 2019) is: 17.6%   Values used to calculate the score:     Age: 64 years     Sex: Male     Is Non-Hispanic African American: Yes     Diabetic: Yes     Tobacco smoker: No     Systolic Blood Pressure: 142 mmHg     Is BP treated: Yes     HDL Cholesterol: 47 mg/dL     Total Cholesterol: 159 mg/dL    Objective:  Lab Results  Component Value Date   HGBA1C 11.9 (A) 05/23/2023    Lab Results  Component Value Date   CREATININE 0.86 03/29/2023   BUN 13 03/29/2023   NA 138 03/29/2023   K 4.0 03/29/2023   CL 101 03/29/2023   CO2 28 03/29/2023    Lab Results  Component Value Date   CHOL 159 03/09/2022   HDL 47 03/09/2022   LDLCALC 99 03/09/2022   TRIG 63 03/09/2022   CHOLHDL 3.4 03/09/2022    Medications Reviewed Today     Reviewed by Amedeo Bailiff, RPH (Pharmacist) on 07/12/23 at 1146  Med List Status: <None>   Medication Order Taking? Sig Documenting Provider Last Dose Status Informant  Accu-Chek Softclix Lancets lancets [id number] No Use as instructed to check  blood sugar twice daily.  Patient not taking: Reported on 07/12/2023   Paseda, Folashade R, FNP Not Taking Active            Med Note Vallarie Gauze, [facility] R   Tue Jul 12, 2023 11:42 AM) Not taking - ran out of test strips and pharmacy sent request last week   amLODipine  (NORVASC ) 10 MG tablet [id number] Yes Take 1 tablet (10 mg total) by mouth daily. Paseda, Folashade R, FNP Taking Active   aspirin EC 81 MG tablet [id number] Yes Take 81 mg by mouth daily. [provider] Taking Active Self  atorvastatin  (LIPITOR) 10 MG tablet [id number] Yes Take 1 tablet (10 mg total) by mouth daily. Paseda, Folashade R, FNP Taking Active   Blood Glucose Monitoring Suppl (ACCU-CHEK GUIDE) w/Device KIT [id number] No 1 each by Does not apply route in the morning and at bedtime.  Patient not taking: Reported on 07/12/2023   Paseda, Folashade R, FNP Not Taking Active            Med Note Vallarie Gauze, [facility] R   Tue Jul 12, 2023 11:42 AM) Ran out of test strips  empagliflozin  (JARDIANCE ) 25 MG TABS tablet [id number] Yes Take 1 tablet (25 mg total) by mouth daily before breakfast. Paseda, Folashade R, FNP Taking Active            Med Note Vallarie Gauze, [facility] R   Tue Jun 14, 2023 12:35 PM) Needs to refill medication as he has not started the medication again  gabapentin  (NEURONTIN ) 100 MG capsule [id number] Yes TAKE 1 CAPSULE BY MOUTH THREE TIMES DAILY Nichols, Tonya S, NP Taking Active   glipiZIDE  (GLUCOTROL ) 5 MG tablet [id number] Yes Take 1 tablet (5 mg total) by mouth 2 (two) times daily before a meal. Paseda, Folashade R, FNP Taking Active   glucose blood (ACCU-CHEK GUIDE TEST) test strip [id number] No Use as instructed  Patient not taking: Reported on 07/12/2023   Paseda, Folashade R, FNP Not Taking Active            Med Note Alexandria Angel R   Tue Jul 12, 2023 11:44 AM) Not taking - "ran out of refills and pharmacy awaiting PCP to send request", per patient. However, patient informed PCP sent prescription on 06/22/2023. Will f/u with pharmacy with patient on the phone  hydrochlorothiazide  (HYDRODIURIL ) 25 MG tablet [id number] Yes Take 1 tablet (25 mg total) by mouth daily. Paseda, Folashade R, FNP Taking Active   ketorolac  (TORADOL ) 10 MG tablet [id number] No Take 1 tablet (10 mg total) by mouth every 6 (six) hours as needed.  Patient not taking: Reported on 07/12/2023   [place] Butter, PA Not Taking Active            Med Note Vallarie Gauze, [facility] R   Tue Jul 12, 2023 11:45 AM) Taking PRN   levocetirizine (XYZAL ) 5 MG tablet [id number] No Take 1 tablet (5 mg total) by mouth every evening.  Patient not taking: Reported on 07/12/2023   Filbert Huff I, NP Not Taking Active   metFORMIN  (GLUCOPHAGE ) 1000 MG tablet [id number] Yes Take 1 tablet (1,000 mg total) by mouth 2 (two) times daily with a meal. Paseda, Folashade R, FNP Taking Active   Multiple Vitamin (MULTIVITAMIN) tablet [id number] Yes Take 1 tablet by mouth daily. [provider] Taking Active   omeprazole (PRILOSEC) 40 MG capsule [id number] Yes Take 40 mg by mouth daily. [provider] Taking Active   [facility] VERIO test strip [id number] No USE 1  STRIP TO CHECK GLUCOSE TWICE DAILY AS DIRECTED  Patient not taking: Reported on 07/12/2023   Paseda, Folashade R, FNP Not Taking Active   sertraline (ZOLOFT) 25 MG tablet [id number] Yes Take 25 mg by mouth daily. [provider] Taking Active   tadalafil (CIALIS) 20 MG tablet [id number] Yes Take 10 mg by mouth daily as needed for erectile dysfunction. [provider] Taking Active   valsartan  (DIOVAN ) 80 MG tablet [id number] Yes Take 1 tablet (80 mg total) by mouth daily. Paseda, Folashade R, FNP Taking Active               Assessment/Plan:   Diabetes: - Currently uncontrolled. Patient is non compliant with checking his blood glucose, even before running out of diabetic test strips.   - Reviewed long term cardiovascular and renal outcomes of uncontrolled blood sugar - Reviewed goal A1c, goal fasting, and goal 2 hour post prandial glucose - Reviewed dietary modifications: patient continues to be interested in becoming a vegan and looks forward to meeting with dietician to possibly help guide that transition in relation to diabetes. - Encouraged patient to exercise 150 minutes/week - Recommend to continue current therapy due limited blood glucose readings. May consider adding on a therapy that will help with compliance such as on weekly injectable  diabetic medication and possibly discontinuing  glipizide  once patient is tolerating therapeutic dose.   - Recommend to check glucose twice daily (fasting and post prandial). The patient was asked to consistently check his blood glucose, sync the results to his phone, and upload them to MyChart. The patient was agreeable. - Will contact pharmacy regarding test strip issues reported by patient.      Hypertension: - Currently uncontrolled based on last office visit blood pressure results.  - Reviewed long term cardiovascular and renal outcomes of uncontrolled blood pressure - Reviewed appropriate blood pressure monitoring technique and reviewed goal blood pressure. Recommended to check home blood pressure and heart rate three times weekly that patient agreed to check based on his lifestyle demands and noncompliance. Discussed goal is to eventually check his blood glucose daily.  Encouraged patient to record blood pressure readings and bring to clinic.  - Recommend to continue current regimen.  Amlodipine  has not been filled since 12/24/2022 for 90 days supply. Will collaborate with PCP whether to renew/restart medication.  - Reviewed adherence using pill box and pill reminder/alarm.      Hyperlipidemia/ASCVD Risk Reduction: - Currently uncontrolled. LDL goal < 70  - Reviewed long term complications of uncontrolled cholesterol - Recommend to continue current medication. Patient reports that he is adherent to all medications yet atorvastatin  has not been filled since 06/06/2023 for 30-days supply, per Dr. Anson Basta. Reviewed adherence. Patient is over due for lipid panel. Will defer labs for PCP.      Follow Up Plan: Next Scheduled  PCP Visit: 07/29/2023, then PharmD telephone 2-3 weeks  Alexandria Angel, PharmD Clinical Pharmacist Cell: [phone]

## 2023-07-18 ENCOUNTER — Telehealth: Payer: Self-pay

## 2023-07-18 DIAGNOSIS — E1165 Type 2 diabetes mellitus with hyperglycemia: Secondary | ICD-10-CM

## 2023-07-18 MED ORDER — AMLODIPINE BESYLATE 10 MG PO TABS
10.0000 mg | ORAL_TABLET | Freq: Every day | ORAL | 3 refills | Status: DC
Start: 2023-07-18 — End: 2023-12-21

## 2023-07-18 MED ORDER — ATORVASTATIN CALCIUM 10 MG PO TABS: 10.0000 mg | ORAL_TABLET | Freq: Every day | ORAL | 0 refills | Status: DC

## 2023-07-18 NOTE — Progress Notes (Unsigned)
   07/18/2023  Patient ID: Noah Bates, male   DOB: 08-19-1975, 48 y.o.   MRN: [id number]  Care Coordination Call  I called the pharmacy regarding a patient-reported issue with test strips and was informed that the patient's insurance does not cover OneTouch test strips. Additionally, the AccuChek prescription does not indicate daily use. According to the pharmacy, the patient last picked up an AccuChek lancet device. In a prior discussion between the patient and the pharmacist, the patient reported using the AccuChek Guide. I will collaborate with the PCP to update the prescription for AccuChek.  Alexandria Angel, PharmD Clinical Pharmacist Cell: [phone]

## 2023-07-19 MED ORDER — ACCU-CHEK GUIDE TEST VI STRP: ORAL_STRIP | 1 refills | Status: AC

## 2023-07-25 ENCOUNTER — Ambulatory Visit: Admitting: Dietician

## 2023-07-26 ENCOUNTER — Other Ambulatory Visit: Payer: Self-pay | Admitting: Nurse Practitioner

## 2023-07-26 DIAGNOSIS — E1349 Other specified diabetes mellitus with other diabetic neurological complication: Secondary | ICD-10-CM

## 2023-07-29 ENCOUNTER — Ambulatory Visit: Payer: Self-pay | Admitting: Nurse Practitioner

## 2023-07-29 ENCOUNTER — Encounter: Payer: Self-pay | Admitting: Nurse Practitioner

## 2023-07-29 VITALS — BP 128/69 | HR 80 | Temp 97.3°F | Wt 231.0 lb

## 2023-07-29 DIAGNOSIS — E1165 Type 2 diabetes mellitus with hyperglycemia: Secondary | ICD-10-CM | POA: Diagnosis not present

## 2023-07-29 DIAGNOSIS — E1149 Type 2 diabetes mellitus with other diabetic neurological complication: Secondary | ICD-10-CM

## 2023-07-29 DIAGNOSIS — I10 Essential (primary) hypertension: Secondary | ICD-10-CM | POA: Diagnosis not present

## 2023-07-29 DIAGNOSIS — E785 Hyperlipidemia, unspecified: Secondary | ICD-10-CM | POA: Diagnosis not present

## 2023-07-29 DIAGNOSIS — K219 Gastro-esophageal reflux disease without esophagitis: Secondary | ICD-10-CM

## 2023-07-29 MED ORDER — FAMOTIDINE 20 MG PO TABS: 20.0000 mg | ORAL_TABLET | Freq: Two times a day (BID) | ORAL | 3 refills | Status: AC | PRN

## 2023-07-29 MED ORDER — EMPAGLIFLOZIN 25 MG PO TABS: 25.0000 mg | ORAL_TABLET | Freq: Every day | ORAL | 1 refills | Status: DC

## 2023-07-29 NOTE — Assessment & Plan Note (Signed)
 BP Readings from Last 3 Encounters:  07/29/23 128/69  05/23/23 (!) 142/74  03/29/23 (!) 153/98   HTN Controlled .  On amlodipine  10 mg daily, hydrochlorothiazide  25 mg daily, valsartan  80 mg daily Continue current medications. No changes in management. DASH diet and dietary sodium restrictions encouraged increase dietary efforts and exercise.

## 2023-07-29 NOTE — Assessment & Plan Note (Signed)
 Lab Results  Component Value Date   CHOL 159 03/09/2022   HDL 47 03/09/2022   LDLCALC 99 03/09/2022   TRIG 63 03/09/2022   CHOLHDL 3.4 03/09/2022  Checking lipid panel LDL goal is less than 70

## 2023-07-29 NOTE — Assessment & Plan Note (Signed)
 Currently on omeprazole 40 mg daily, stated that the medication is not working would like to switch to famotidine Famotidine 20 mg twice daily as needed ordered Avoid fatty fried foods, spicy foods, caffeinated drinks, and other foods that triggers acid reflux symptoms, avoid eating about 2 hours before bedtime

## 2023-07-29 NOTE — Progress Notes (Signed)
 Established Patient Office Visit  Subjective:  Patient ID: Noah Bates, male    DOB: 21-Mar-1975  Age: 48 y.o. MRN: [id number]  CC:  Chief Complaint  Patient presents with   Hypertension   Hyperlipidemia    HPI Noah Bates is a 48 y.o. male   has a past medical history of Decreased libido (08/2019), Diabetes mellitus without complication (HCC), Hyperlipidemia (05/2019), Hypertension, and Vitamin D  deficiency (05/2019).  Patient presents for follow-up for his chronic medical conditions  Hypertension.  Currently on amlodipine  10 mg daily, hydrochlorothiazide  25 mg daily, valsartan  80 mg daily. patient denies chest pain shortness of breath edema.  Type 2 diabetes.  Currently on glipizide  5 mg twice daily, metformin  1000 mg twice daily, Jardiance  25 mg daily.  States that fasting blood sugar has been in the 120s, blood cbg in the 150s.  Takes atorvastatin  10 mg daily for hyperlipidemia ,he denies polyuria polyphagia polydipsia.  Patient encouraged to get pneumococcal vaccine at the pharmacy     Past Medical History:  Diagnosis Date   Decreased libido 08/2019   Diabetes mellitus without complication (HCC)    Hyperlipidemia 05/2019   Hypertension    Vitamin D  deficiency 05/2019    Past Surgical History:  Procedure Laterality Date   BACK SURGERY      Family History  Problem Relation Age of Onset   Diabetes Mellitus II Mother    Diabetes Mellitus II Father    Stroke Father    Cancer - Colon Neg Hx     Social History   Socioeconomic History   Marital status: Married    Spouse name: Not on file   Number of children: 4   Years of education: Not on file   Highest education level: Not on file  Occupational History   Not on file  Tobacco Use   Smoking status: Never   Smokeless tobacco: Never  Vaping Use   Vaping status: Never Used  Substance and Sexual Activity   Alcohol use: Not Currently   Drug use: Never   Sexual activity: Yes  Other Topics  Concern   Not on file  Social History Narrative   Lives with wife.    Social Drivers of Corporate investment banker Strain: Not on file  Food Insecurity: No Food Insecurity (03/31/2023)   Hunger Vital Sign    Worried About Running Out of Food in the Last Year: Never true    Ran Out of Food in the Last Year: Never true  Transportation Needs: No Transportation Needs (03/31/2023)   PRAPARE - Administrator, Civil Service (Medical): No    Lack of Transportation (Non-Medical): No  Physical Activity: Not on file  Stress: Not on file  Social Connections: Not on file  Intimate Partner Violence: Not At Risk (03/31/2023)   Humiliation, Afraid, Rape, and Kick questionnaire    Fear of Current or Ex-Partner: No    Emotionally Abused: No    Physically Abused: No    Sexually Abused: No    Outpatient Medications Prior to Visit  Medication Sig Dispense Refill   Accu-Chek Softclix Lancets lancets Use as instructed to check blood sugar twice daily. 100 each 12   amLODipine  (NORVASC ) 10 MG tablet Take 1 tablet (10 mg total) by mouth daily. 90 tablet 3   aspirin EC 81 MG tablet Take 81 mg by mouth daily.     atorvastatin  (LIPITOR) 10 MG tablet Take 1 tablet (10 mg total) by mouth daily.  60 tablet 0   Blood Glucose Monitoring Suppl (ACCU-CHEK GUIDE) w/Device KIT 1 each by Does not apply route in the morning and at bedtime. 1 kit 0   gabapentin  (NEURONTIN ) 100 MG capsule TAKE 1 CAPSULE BY MOUTH THREE TIMES DAILY 90 capsule 0   glipiZIDE  (GLUCOTROL ) 5 MG tablet Take 1 tablet (5 mg total) by mouth 2 (two) times daily before a meal. 180 tablet 1   glucose blood (ACCU-CHEK GUIDE TEST) test strip USE 1 STRIP TO CHECK GLUCOSE TWICE DAILY AS DIRECTED 100 each 1   hydrochlorothiazide  (HYDRODIURIL ) 25 MG tablet Take 1 tablet (25 mg total) by mouth daily. 90 tablet 3   metFORMIN  (GLUCOPHAGE ) 1000 MG tablet Take 1 tablet (1,000 mg total) by mouth 2 (two) times daily with a meal. 180 tablet 3   Multiple  Vitamin (MULTIVITAMIN) tablet Take 1 tablet by mouth daily.     sertraline (ZOLOFT) 25 MG tablet Take 25 mg by mouth daily.     tadalafil (CIALIS) 20 MG tablet Take 10 mg by mouth daily as needed for erectile dysfunction.     valsartan  (DIOVAN ) 80 MG tablet Take 1 tablet (80 mg total) by mouth daily. 90 tablet 3   empagliflozin  (JARDIANCE ) 25 MG TABS tablet Take 1 tablet (25 mg total) by mouth daily before breakfast. 90 tablet 1   ketorolac  (TORADOL ) 10 MG tablet Take 1 tablet (10 mg total) by mouth every 6 (six) hours as needed. (Patient not taking: Reported on 07/29/2023) 20 tablet 0   levocetirizine (XYZAL ) 5 MG tablet Take 1 tablet (5 mg total) by mouth every evening. (Patient not taking: Reported on 03/31/2023) 30 tablet 11   omeprazole (PRILOSEC) 40 MG capsule Take 40 mg by mouth daily. (Patient not taking: Reported on 07/29/2023)     No facility-administered medications prior to visit.    No Known Allergies  ROS Review of Systems  Constitutional:  Negative for appetite change, chills, fatigue and fever.  HENT:  Negative for congestion, postnasal drip, rhinorrhea and sneezing.   Respiratory:  Negative for cough, shortness of breath and wheezing.   Cardiovascular:  Negative for chest pain, palpitations and leg swelling.  Gastrointestinal:  Negative for abdominal pain, constipation, nausea and vomiting.  Genitourinary:  Negative for difficulty urinating, dysuria, flank pain and frequency.  Musculoskeletal:  Negative for arthralgias, back pain, joint swelling and myalgias.  Skin:  Negative for color change, pallor, rash and wound.  Neurological:  Negative for dizziness, facial asymmetry, weakness, numbness and headaches.  Psychiatric/Behavioral:  Negative for behavioral problems, confusion, self-injury and suicidal ideas.       Objective:     Physical Exam Vitals and nursing note reviewed.  Constitutional:      General: He is not in acute distress.    Appearance: Normal  appearance. He is not ill-appearing, toxic-appearing or diaphoretic.  Eyes:     General: No scleral icterus.       Right eye: No discharge.        Left eye: No discharge.     Conjunctiva/sclera: Conjunctivae normal.  Cardiovascular:     Rate and Rhythm: Normal rate and regular rhythm.     Pulses: Normal pulses.     Heart sounds: Normal heart sounds. No murmur heard.    No friction rub. No gallop.  Pulmonary:     Effort: Pulmonary effort is normal. No respiratory distress.     Breath sounds: Normal breath sounds. No stridor. No wheezing, rhonchi or rales.  Chest:  Chest wall: No tenderness.  Abdominal:     General: There is no distension.     Palpations: Abdomen is soft.     Tenderness: There is no abdominal tenderness. There is no right CVA tenderness, left CVA tenderness or guarding.  Musculoskeletal:        General: No swelling, tenderness, deformity or signs of injury.     Right lower leg: No edema.     Left lower leg: No edema.  Skin:    General: Skin is warm and dry.     Capillary Refill: Capillary refill takes less than 2 seconds.     Coloration: Skin is not jaundiced or pale.     Findings: No bruising, erythema or lesion.  Neurological:     Mental Status: He is alert and oriented to person, place, and time.     Motor: No weakness.     Coordination: Coordination normal.     Gait: Gait normal.  Psychiatric:        Mood and Affect: Mood normal.        Behavior: Behavior normal.        Thought Content: Thought content normal.        Judgment: Judgment normal.     BP 128/69   Pulse 80   Temp (!) 97.3 F (36.3 C)   Wt 231 lb (104.8 kg)   SpO2 100%   BMI 26.02 kg/m  Wt Readings from Last 3 Encounters:  07/29/23 231 lb (104.8 kg)  05/23/23 233 lb 6.4 oz (105.9 kg)  03/29/23 245 lb (111.1 kg)    Lab Results  Component Value Date   TSH 2.060 01/28/2021   Lab Results  Component Value Date   WBC 3.6 (L) 03/29/2023   HGB 14.5 03/29/2023   HCT 43.5  03/29/2023   MCV 80.9 03/29/2023   PLT 165 03/29/2023   Lab Results  Component Value Date   NA 138 03/29/2023   K 4.0 03/29/2023   CO2 28 03/29/2023   GLUCOSE 307 (H) 03/29/2023   BUN 13 03/29/2023   CREATININE 0.86 03/29/2023   BILITOT 0.8 10/18/2022   ALKPHOS 63 10/18/2022   AST 13 10/18/2022   ALT 23 10/18/2022   PROT 6.9 10/18/2022   ALBUMIN 4.7 10/18/2022   CALCIUM  9.3 03/29/2023   ANIONGAP 9 03/29/2023   EGFR 78 10/18/2022   Lab Results  Component Value Date   CHOL 159 03/09/2022   Lab Results  Component Value Date   HDL 47 03/09/2022   Lab Results  Component Value Date   LDLCALC 99 03/09/2022   Lab Results  Component Value Date   TRIG 63 03/09/2022   Lab Results  Component Value Date   CHOLHDL 3.4 03/09/2022   Lab Results  Component Value Date   HGBA1C 11.9 (A) 05/23/2023      Assessment & Plan:   Problem List Items Addressed This Visit       Cardiovascular and Mediastinum   Essential hypertension   BP Readings from Last 3 Encounters:  07/29/23 128/69  05/23/23 (!) 142/74  03/29/23 (!) 153/98   HTN Controlled .  On amlodipine  10 mg daily, hydrochlorothiazide  25 mg daily, valsartan  80 mg daily Continue current medications. No changes in management. DASH diet and dietary sodium restrictions encouraged increase dietary efforts and exercise.         Relevant Orders   Basic Metabolic Panel     Digestive   Gastroesophageal reflux disease   Currently on omeprazole 40  mg daily, stated that the medication is not working would like to switch to famotidine Famotidine 20 mg twice daily as needed ordered Avoid fatty fried foods, spicy foods, caffeinated drinks, and other foods that triggers acid reflux symptoms, avoid eating about 2 hours before bedtime      Relevant Medications   famotidine (PEPCID) 20 MG tablet     Endocrine   Type 2 diabetes mellitus with hyperglycemia (HCC)   Relevant Medications   empagliflozin  (JARDIANCE ) 25 MG TABS  tablet   Diabetic neuropathy (HCC)   Has loss of sensation on left foot and some areas on the right Importance of daily self diabetic exam discussed Patient referred to podiatrist may benefit from a diabetic shoe      Relevant Medications   empagliflozin  (JARDIANCE ) 25 MG TABS tablet   Other Relevant Orders   Ambulatory referral to Podiatry     Other   Dyslipidemia, goal LDL below 70 - Primary   Lab Results  Component Value Date   CHOL 159 03/09/2022   HDL 47 03/09/2022   LDLCALC 99 03/09/2022   TRIG 63 03/09/2022   CHOLHDL 3.4 03/09/2022  Checking lipid panel LDL goal is less than 70      Relevant Medications   empagliflozin  (JARDIANCE ) 25 MG TABS tablet   Other Relevant Orders   Ambulatory referral to Ophthalmology   Lipid panel    Meds ordered this encounter  Medications   famotidine (PEPCID) 20 MG tablet    Sig: Take 1 tablet (20 mg total) by mouth 2 (two) times daily as needed for heartburn or indigestion.    Dispense:  60 tablet    Refill:  3   empagliflozin  (JARDIANCE ) 25 MG TABS tablet    Sig: Take 1 tablet (25 mg total) by mouth daily before breakfast.    Dispense:  90 tablet    Refill:  1    Follow-up: Return in about 5 weeks (around 09/02/2023) for DM.    Derra Shartzer R Yuleidy Rappleye, FNP

## 2023-07-29 NOTE — Assessment & Plan Note (Signed)
 Has loss of sensation on left foot and some areas on the right Importance of daily self diabetic exam discussed Patient referred to podiatrist may benefit from a diabetic shoe

## 2023-07-29 NOTE — Patient Instructions (Addendum)
    Goal for fasting blood sugar ranges from 80 to 120 and 2 hours after any meal or at bedtime should be between 130 to 170.   1. Hyperlipidemia associated with type 2 diabetes mellitus (HCC) (Primary)  - Ambulatory referral to Ophthalmology - Lipid panel - Ambulatory referral to Podiatry  2. Gastroesophageal reflux disease, unspecified whether esophagitis present  - famotidine (PEPCID) 20 MG tablet; Take 1 tablet (20 mg total) by mouth 2 (two) times daily as needed for heartburn or indigestion.  Dispense: 60 tablet; Refill: 3  3. Essential hypertension  - Basic Metabolic Panel    It is important that you exercise regularly at least 30 minutes 5 times a week as tolerated  Think about what you will eat, plan ahead. Choose " clean, green, fresh or frozen" over canned, processed or packaged foods which are more sugary, salty and fatty. 70 to 75% of food eaten should be vegetables and fruit. Three meals at set times with snacks allowed between meals, but they must be fruit or vegetables. Aim to eat over a 12 hour period , example 7 am to 7 pm, and STOP after  your last meal of the day. Drink water,generally about 64 ounces per day, no other drink is as healthy. Fruit juice is best enjoyed in a healthy way, by EATING the fruit.  Thanks for choosing [facility] we consider it a privelige to serve you.

## 2023-07-30 LAB — BASIC METABOLIC PANEL WITH GFR
BUN/Creatinine Ratio: 15 (ref 9–20)
BUN: 15 mg/dL (ref 6–24)
CO2: 23 mmol/L (ref 20–29)
Calcium: 10 mg/dL (ref 8.7–10.2)
Chloride: 101 mmol/L (ref 96–106)
Creatinine, Ser: 1.03 mg/dL (ref 0.76–1.27)
Glucose: 99 mg/dL (ref 70–99)
Potassium: 4.5 mmol/L (ref 3.5–5.2)
Sodium: 142 mmol/L (ref 134–144)
eGFR: 89 mL/min/{1.73_m2} (ref >59)

## 2023-07-30 LAB — LIPID PANEL
Chol/HDL Ratio: 3.1 ratio (ref 0.0–5.0)
Cholesterol, Total: 154 mg/dL (ref 100–199)
HDL: 50 mg/dL (ref >39)
LDL Chol Calc (NIH): 91 mg/dL (ref 0–99)
Triglycerides: 63 mg/dL (ref 0–149)
VLDL Cholesterol Cal: 13 mg/dL (ref 5–40)

## 2023-08-02 ENCOUNTER — Ambulatory Visit: Payer: Self-pay | Admitting: Nurse Practitioner

## 2023-08-02 ENCOUNTER — Other Ambulatory Visit: Payer: Self-pay | Admitting: Nurse Practitioner

## 2023-08-02 DIAGNOSIS — E785 Hyperlipidemia, unspecified: Secondary | ICD-10-CM

## 2023-08-02 MED ORDER — ATORVASTATIN CALCIUM 20 MG PO TABS: 20.0000 mg | ORAL_TABLET | Freq: Every day | ORAL | 1 refills | Status: DC

## 2023-08-15 ENCOUNTER — Ambulatory Visit: Admitting: Dietician

## 2023-08-23 ENCOUNTER — Telehealth: Payer: Self-pay

## 2023-08-23 ENCOUNTER — Other Ambulatory Visit

## 2023-08-23 NOTE — Progress Notes (Deleted)
 08/23/2023 Name: Noah Bates MRN: [id number] DOB: 1975/05/07  No chief complaint on file.   Noah Bates is a 48 y.o. year old male who presented for a telephone visit.   They were referred to the pharmacist by their PCP for assistance in managing diabetes, hypertension, and hyperlipidemia.    Subjective: Patient was last seen by PCP, Noah Lu, NP on 07/29/23. He reported adherence to his maintenance medications.  Care Team: Primary Care Provider: Paseda, Folashade R, FNP ; Next Scheduled Visit: *** {careteamprovider:27366}  Medication Access/Adherence  Current Pharmacy:  [id number] - [place] ([place]), [place] - [address] [place] ([place]) [place] [address] Phone: [phone] Fax: [phone]   Patient reports affordability concerns with their medications: {YES/NO:21197} Patient reports access/transportation concerns to their pharmacy: {YES/NO:21197} Patient reports adherence concerns with their medications:  {YES/NO:21197} ***   Diabetes:  Current medications: Jardiance  25 mg daily, metformin  1000 mg twice daily, glipizide  5 mg twice daily  Medications tried in the past:   Current glucose readings: *** Using *** meter; testing *** times daily  Date of Download: *** % Time CGM is active: ***% Average Glucose: *** mg/dL Glucose Management Indicator: ***  Glucose Variability: *** (goal <36%) Time in Goal:  - Time in range 70-180: ***% - Time above range: ***% - Time below range: ***% Observed patterns:  Patient {Actions; denies-reports:120008} hypoglycemic s/sx including ***dizziness, shakiness, sweating. Patient {Actions; denies-reports:120008} hyperglycemic symptoms including ***polyuria, polydipsia, polyphagia, nocturia, neuropathy, blurred vision.  Current meal patterns:  - Breakfast: *** - Lunch *** - Supper *** - Snacks *** - Drinks ***  Current physical activity: ***  Current medication access  support: ***  Hypertension:  Current medications: hydrochlorothiazide  25 mg daily, amlodipine  10 mg daily, valsartan  80 mg daily  Medications previously tried:   Patient {HAS/DOES NOT ONGE:95284} a validated, automated, upper arm home BP cuff Current blood pressure readings readings: ***  Patient {Actions; denies-reports:120008} hypotensive s/sx including ***dizziness, lightheadedness.  Patient {Actions; denies-reports:120008} hypertensive symptoms including ***headache, chest pain, shortness of breath  Current meal patterns: ***  Current physical activity: ***   Hyperlipidemia/ASCVD Risk Reduction  Current lipid lowering medications:  Medications tried in the past:   Antiplatelet regimen:   ASCVD History:  Family History:  Risk Factors:   Current physical activity: ***  Current medication access support: ***  Clinical ASCVD: {YES/NO:21197} The 10-year ASCVD risk score (Arnett DK, et al., 2019) is: 14.3%   Values used to calculate the score:     Age: 51 years     Clincally relevant sex: Male     Is Non-Hispanic African American: Yes     Diabetic: Yes     Tobacco smoker: No     Systolic Blood Pressure: 128 mmHg     Is BP treated: Yes     HDL Cholesterol: 50 mg/dL     Total Cholesterol: 154 mg/dL    PREVENT Risk Score: 10 year risk of CVD: *** - 10 year risk of ASCVD: *** - 10 year risk of HF: ***   Objective:  BP Readings from Last 3 Encounters:  07/29/23 128/69  05/23/23 (!) 142/74  03/29/23 (!) 153/98   Lab Results  Component Value Date   HGBA1C 11.9 (A) 05/23/2023   HGBA1C 7.9 (A) 10/18/2022   HGBA1C 12.8 (A) 03/09/2022    Lab Results  Component Value Date   CREATININE 1.03 07/29/2023   BUN 15 07/29/2023   NA 142 07/29/2023   K  4.5 07/29/2023   CL 101 07/29/2023   CO2 23 07/29/2023    Lab Results  Component Value Date   CHOL 154 07/29/2023   HDL 50 07/29/2023   LDLCALC 91 07/29/2023   TRIG 63 07/29/2023   CHOLHDL 3.1 07/29/2023     Medications Reviewed Today   Medications were not reviewed in this encounter       Assessment/Plan:   Diabetes: - Currently {CHL Controlled/Uncontrolled:[id number]} - Reviewed long term cardiovascular and renal outcomes of uncontrolled blood sugar - Reviewed goal A1c, goal fasting, and goal 2 hour post prandial glucose - Reviewed dietary modifications including *** - Reviewed lifestyle modifications including:  - Recommend to ***  - Patient denies personal or family history of multiple endocrine neoplasia type 2, medullary thyroid cancer; personal history of pancreatitis or gallbladder disease. - Recommend to check glucose *** - Meets financial criteria for *** patient assistance program through ***. Will collaborate with provider, CPhT, and patient to pursue assistance.      Hypertension: - Currently {CHL Controlled/Uncontrolled:[id number]} - Reviewed long term cardiovascular and renal outcomes of uncontrolled blood pressure - Reviewed appropriate blood pressure monitoring technique and reviewed goal blood pressure. Recommended to check home blood pressure and heart rate *** - Recommend to ***      Hyperlipidemia/ASCVD Risk Reduction: - Currently {CHL Controlled/Uncontrolled:[id number]}.  - Reviewed long term complications of uncontrolled cholesterol - Reviewed dietary recommendations including *** - Reviewed lifestyle recommendations including *** - Recommend to ***  - Meets financial criteria for *** patient assistance program through ***. Will collaborate with provider, CPhT, and patient to pursue assistance.    Follow Up Plan: ***  ***

## 2023-08-23 NOTE — Telephone Encounter (Signed)
 Attempted to contact patient for scheduled appointment for medication management. Was unable to leave VM as there was an error dialing out call cannot be completed as dialed.. Will reach out via MyChart.   Arthea Larsson, PharmD PGY1 Pharmacy Resident

## 2023-08-28 ENCOUNTER — Telehealth: Payer: Self-pay | Admitting: Nurse Practitioner

## 2023-08-28 NOTE — Telephone Encounter (Signed)
 Patient was identified as falling into the [place] Measure - Diabetes.   Patient was: Referred to pharmacy for chronic disease management.

## 2023-08-29 ENCOUNTER — Other Ambulatory Visit: Payer: Self-pay | Admitting: Nurse Practitioner

## 2023-08-29 DIAGNOSIS — E1349 Other specified diabetes mellitus with other diabetic neurological complication: Secondary | ICD-10-CM

## 2023-08-29 NOTE — Telephone Encounter (Signed)
 Please advise [facility]

## 2023-09-07 ENCOUNTER — Telehealth: Payer: Self-pay | Admitting: *Deleted

## 2023-09-07 NOTE — Progress Notes (Signed)
 Complex Care Management Care Guide Note  09/07/2023 Name: Noah Bates MRN: [id number] DOB: [birth year]  Noah Bates is a 48 y.o. year old male who is a primary care patient of Paseda, Folashade R, FNP and is actively engaged with the care management team. I reached out to Issac Sheria Meissner by phone today to assist with re-scheduling  with the Pharmacist.  Follow up plan: Unsuccessful telephone outreach attempt made.   Harlene Satterfield  [facility]  [facility], [facility] Guide  Direct Dial: [phone]  Fax [phone]

## 2023-09-13 NOTE — Progress Notes (Signed)
 Complex Care Management Care Guide Note  09/13/2023 Name: Noah Bates MRN: [id number] DOB: 06-11-1975  Noah Bates is a 48 y.o. year old male who is a primary care patient of Paseda, Folashade R, FNP and is actively engaged with the care management team. I reached out to Noah Bates by phone today to assist with re-scheduling  with the Pharmacist.  Follow up plan: Unsuccessful telephone outreach attempt made. A HIPAA compliant phone message was left for the patient providing contact information and requesting a return call. No further outreach attempts will be made at this time. We have been unable to contact the patient to reschedule for complex care management services.   Harlene Satterfield  [facility]  [facility], [facility] Guide  Direct Dial: [phone]  Fax [phone]

## 2023-09-20 ENCOUNTER — Ambulatory Visit: Payer: Self-pay | Admitting: Nurse Practitioner

## 2023-09-20 ENCOUNTER — Encounter: Payer: Self-pay | Admitting: Nurse Practitioner

## 2023-09-20 VITALS — BP 130/66 | HR 82 | Temp 97.0°F | Wt 228.0 lb

## 2023-09-20 DIAGNOSIS — E1142 Type 2 diabetes mellitus with diabetic polyneuropathy: Secondary | ICD-10-CM | POA: Diagnosis not present

## 2023-09-20 DIAGNOSIS — I10 Essential (primary) hypertension: Secondary | ICD-10-CM

## 2023-09-20 DIAGNOSIS — E785 Hyperlipidemia, unspecified: Secondary | ICD-10-CM

## 2023-09-20 DIAGNOSIS — E1165 Type 2 diabetes mellitus with hyperglycemia: Secondary | ICD-10-CM | POA: Diagnosis not present

## 2023-09-20 DIAGNOSIS — G8929 Other chronic pain: Secondary | ICD-10-CM | POA: Insufficient documentation

## 2023-09-20 DIAGNOSIS — M5441 Lumbago with sciatica, right side: Secondary | ICD-10-CM

## 2023-09-20 LAB — POCT GLYCOSYLATED HEMOGLOBIN (HGB A1C): Hemoglobin A1C: 8.4 % — AB (ref 4.0–5.6)

## 2023-09-20 MED ORDER — IBUPROFEN 600 MG PO TABS: 600.0000 mg | ORAL_TABLET | Freq: Three times a day (TID) | ORAL | 0 refills | Status: DC | PRN

## 2023-09-20 MED ORDER — CYCLOBENZAPRINE HCL 10 MG PO TABS: 10.0000 mg | ORAL_TABLET | Freq: Two times a day (BID) | ORAL | 0 refills | Status: AC | PRN

## 2023-09-20 MED ORDER — GABAPENTIN 300 MG PO CAPS: 300.0000 mg | ORAL_CAPSULE | Freq: Three times a day (TID) | ORAL | 3 refills | Status: AC

## 2023-09-20 MED ORDER — GLIPIZIDE 5 MG PO TABS: ORAL_TABLET | ORAL | 1 refills | Status: DC

## 2023-09-20 NOTE — Assessment & Plan Note (Signed)
 BP Readings from Last 3 Encounters:  09/20/23 130/66  07/29/23 128/69  05/23/23 (!) 142/74  Controlled on amlodipine  10 mg daily, hydrochlorothiazide  25 mg daily, valsartan  80 mg daily Continue current medications DASH diet and dietary sodium restriction encouraged

## 2023-09-20 NOTE — Progress Notes (Signed)
 Established Patient Office Visit  Subjective:  Patient ID: Noah Bates, male    DOB: May 21, 1975  Age: 48 y.o. MRN: [id number]  CC:  Chief Complaint  Patient presents with   Diabetes    HPI Noah Bates is a 48 y.o. male  has a past medical history of Decreased libido (08/2019), Diabetes mellitus without complication (HCC), Hyperlipidemia (05/2019), Hypertension, and Vitamin D  deficiency (05/2019).  Patient presents for follow-up for his chronic medical conditions  Hypertension.  Currently amlodipine  10 mg daily, hydrochlorothiazide  25 mg daily, valsartan  80 mg daily.  Denies chest pain shortness of breath edema  Type 2 diabetes.  Currently on glipizide  5 mg twice daily, metformin  1000 mg twice daily, Jardiance  25 mg daily.  Patient denies hypoglycemia   Chronic low back pain.  Patient complains of chronic intermittent low back pain had a back surgery in 2014.  His pain is mainly on the right side and rated 3/10 ,sometimes radiates down his right leg    Past Medical History:  Diagnosis Date   Decreased libido 08/2019   Diabetes mellitus without complication (HCC)    Hyperlipidemia 05/2019   Hypertension    Vitamin D  deficiency 05/2019    Past Surgical History:  Procedure Laterality Date   BACK SURGERY      Family History  Problem Relation Age of Onset   Diabetes Mellitus II Mother    Diabetes Mellitus II Father    Stroke Father    Cancer - Colon Neg Hx     Social History   Socioeconomic History   Marital status: Married    Spouse name: Not on file   Number of children: 4   Years of education: Not on file   Highest education level: Not on file  Occupational History   Not on file  Tobacco Use   Smoking status: Never   Smokeless tobacco: Never  Vaping Use   Vaping status: Never Used  Substance and Sexual Activity   Alcohol use: Not Currently   Drug use: Never   Sexual activity: Yes  Other Topics Concern   Not on file  Social History  Narrative   Lives with wife.    Social Drivers of Corporate investment banker Strain: Medium Risk (09/14/2023)   Received from [facility]   Overall Financial Resource Strain (CARDIA)    Difficulty of Paying Living Expenses: Somewhat hard  Food Insecurity: Food Insecurity Present (09/14/2023)   Received from [facility]   Hunger Vital Sign    Within the past 12 months, you worried that your food would run out before you got the money to buy more.: Sometimes true    Within the past 12 months, the food you bought just didn't last and you didn't have money to get more.: Often true  Transportation Needs: No Transportation Needs (09/14/2023)   Received from [facility] - Transportation    In the past 12 months, has lack of transportation kept you from medical appointments or from getting medications?: No    Lack of Transportation (Non-Medical): No  Physical Activity: Not on file  Stress: Not on file  Social Connections: Not on file  Intimate Partner Violence: Not At Risk (03/31/2023)   Humiliation, Afraid, Rape, and Kick questionnaire    Fear of Current or Ex-Partner: No    Emotionally Abused: No    Physically Abused: No    Sexually Abused: No    Outpatient Medications  Prior to Visit  Medication Sig Dispense Refill   Accu-Chek Softclix Lancets lancets Use as instructed to check blood sugar twice daily. 100 each 12   amLODipine  (NORVASC ) 10 MG tablet Take 1 tablet (10 mg total) by mouth daily. 90 tablet 3   aspirin EC 81 MG tablet Take 81 mg by mouth daily.     atorvastatin  (LIPITOR) 20 MG tablet Take 1 tablet (20 mg total) by mouth daily. 90 tablet 1   Blood Glucose Monitoring Suppl (ACCU-CHEK GUIDE) w/Device KIT 1 each by Does not apply route in the morning and at bedtime. 1 kit 0   empagliflozin  (JARDIANCE ) 25 MG TABS tablet Take 1 tablet (25 mg total) by mouth daily before breakfast. 90 tablet 1   famotidine  (PEPCID ) 20 MG  tablet Take 1 tablet (20 mg total) by mouth 2 (two) times daily as needed for heartburn or indigestion. 60 tablet 3   glucose blood (ACCU-CHEK GUIDE TEST) test strip USE 1 STRIP TO CHECK GLUCOSE TWICE DAILY AS DIRECTED 100 each 1   hydrochlorothiazide  (HYDRODIURIL ) 25 MG tablet Take 1 tablet (25 mg total) by mouth daily. 90 tablet 3   metFORMIN  (GLUCOPHAGE ) 1000 MG tablet Take 1 tablet (1,000 mg total) by mouth 2 (two) times daily with a meal. 180 tablet 3   Multiple Vitamin (MULTIVITAMIN) tablet Take 1 tablet by mouth daily.     sertraline (ZOLOFT) 25 MG tablet Take 25 mg by mouth daily.     tadalafil (CIALIS) 20 MG tablet Take 10 mg by mouth daily as needed for erectile dysfunction.     valsartan  (DIOVAN ) 80 MG tablet Take 1 tablet (80 mg total) by mouth daily. 90 tablet 3   gabapentin  (NEURONTIN ) 100 MG capsule TAKE 1 CAPSULE BY MOUTH THREE TIMES DAILY 90 capsule 0   glipiZIDE  (GLUCOTROL ) 5 MG tablet Take 1 tablet (5 mg total) by mouth 2 (two) times daily before a meal. 180 tablet 1   levocetirizine (XYZAL ) 5 MG tablet Take 1 tablet (5 mg total) by mouth every evening. (Patient not taking: Reported on 09/20/2023) 30 tablet 11   ketorolac  (TORADOL ) 10 MG tablet Take 1 tablet (10 mg total) by mouth every 6 (six) hours as needed. (Patient not taking: Reported on 09/20/2023) 20 tablet 0   No facility-administered medications prior to visit.    No Known Allergies  ROS Review of Systems  Constitutional:  Negative for appetite change, chills, fatigue and fever.  HENT:  Negative for congestion, postnasal drip, rhinorrhea and sneezing.   Respiratory:  Negative for cough, shortness of breath and wheezing.   Cardiovascular:  Negative for chest pain, palpitations and leg swelling.  Gastrointestinal:  Negative for abdominal pain, constipation, nausea and vomiting.  Genitourinary:  Negative for difficulty urinating, dysuria, flank pain and frequency.  Musculoskeletal:  Positive for back pain. Negative  for joint swelling and myalgias.  Skin:  Negative for color change, pallor, rash and wound.  Neurological:  Negative for dizziness, facial asymmetry, weakness, numbness and headaches.  Psychiatric/Behavioral:  Negative for behavioral problems, confusion, self-injury and suicidal ideas.       Objective:    Physical Exam Vitals and nursing note reviewed.  Constitutional:      General: He is not in acute distress.    Appearance: Normal appearance. He is not ill-appearing, toxic-appearing or diaphoretic.  Eyes:     General: No scleral icterus.       Right eye: No discharge.        Left eye: No  discharge.     Extraocular Movements: Extraocular movements intact.     Conjunctiva/sclera: Conjunctivae normal.  Cardiovascular:     Rate and Rhythm: Normal rate and regular rhythm.     Pulses: Normal pulses.     Heart sounds: Normal heart sounds. No murmur heard.    No friction rub. No gallop.  Pulmonary:     Effort: Pulmonary effort is normal. No respiratory distress.     Breath sounds: Normal breath sounds. No stridor. No wheezing, rhonchi or rales.  Chest:     Chest wall: No tenderness.  Abdominal:     General: There is no distension.     Palpations: Abdomen is soft.     Tenderness: There is no abdominal tenderness. There is no right CVA tenderness, left CVA tenderness or guarding.  Musculoskeletal:        General: Tenderness present. No swelling, deformity or signs of injury.     Right lower leg: No edema.     Left lower leg: No edema.     Comments: Tenderness on palpation of low back on the right side, able to ambulate without assistance  Skin:    General: Skin is warm and dry.     Capillary Refill: Capillary refill takes less than 2 seconds.     Coloration: Skin is not jaundiced or pale.     Findings: No bruising, erythema or lesion.  Neurological:     Mental Status: He is alert and oriented to person, place, and time.     Motor: No weakness.     Gait: Gait normal.   Psychiatric:        Mood and Affect: Mood normal.        Behavior: Behavior normal.        Thought Content: Thought content normal.        Judgment: Judgment normal.     BP 130/66   Pulse 82   Temp (!) 97 F (36.1 C)   Wt 228 lb (103.4 kg)   SpO2 99%   BMI 25.69 kg/m  Wt Readings from Last 3 Encounters:  09/20/23 228 lb (103.4 kg)  07/29/23 231 lb (104.8 kg)  05/23/23 233 lb 6.4 oz (105.9 kg)    Lab Results  Component Value Date   TSH 2.060 01/28/2021   Lab Results  Component Value Date   WBC 3.6 (L) 03/29/2023   HGB 14.5 03/29/2023   HCT 43.5 03/29/2023   MCV 80.9 03/29/2023   PLT 165 03/29/2023   Lab Results  Component Value Date   NA 142 07/29/2023   K 4.5 07/29/2023   CO2 23 07/29/2023   GLUCOSE 99 07/29/2023   BUN 15 07/29/2023   CREATININE 1.03 07/29/2023   BILITOT 0.8 10/18/2022   ALKPHOS 63 10/18/2022   AST 13 10/18/2022   ALT 23 10/18/2022   PROT 6.9 10/18/2022   ALBUMIN 4.7 10/18/2022   CALCIUM  10.0 07/29/2023   ANIONGAP 9 03/29/2023   EGFR 89 07/29/2023   Lab Results  Component Value Date   CHOL 154 07/29/2023   Lab Results  Component Value Date   HDL 50 07/29/2023   Lab Results  Component Value Date   LDLCALC 91 07/29/2023   Lab Results  Component Value Date   TRIG 63 07/29/2023   Lab Results  Component Value Date   CHOLHDL 3.1 07/29/2023   Lab Results  Component Value Date   HGBA1C 8.4 (A) 09/20/2023      Assessment & Plan:  Problem List Items Addressed This Visit       Cardiovascular and Mediastinum   Essential hypertension   BP Readings from Last 3 Encounters:  09/20/23 130/66  07/29/23 128/69  05/23/23 (!) 142/74  Controlled on amlodipine  10 mg daily, hydrochlorothiazide  25 mg daily, valsartan  80 mg daily Continue current medications DASH diet and dietary sodium restriction encouraged        Endocrine   Type 2 diabetes mellitus with hyperglycemia (HCC) - Primary   Lab Results  Component Value Date    HGBA1C 8.4 (A) 09/20/2023  Much improved from 11.9   3 months ago Continue metformin  1000 mg twice daily, Jardiance  25 mg daily Start glipizide  10 mg with breakfast continue 5 mg with dinner Not starting a GLP 1 today due to his current BMI Has upcoming appointment with the clinical pharmacist . Patient counseled on low-carb diet      Relevant Medications   glipiZIDE  (GLUCOTROL ) 5 MG tablet   Other Relevant Orders   POCT glycosylated hemoglobin (Hb A1C) (Completed)   Lipid panel   CBC   Diabetic neuropathy (HCC)   Feels better on gabapentin  100 mg 3 times daily Will increase to 300 mg 3 times daily      Relevant Medications   gabapentin  (NEURONTIN ) 300 MG capsule   glipiZIDE  (GLUCOTROL ) 5 MG tablet     Nervous and Auditory   Chronic right-sided low back pain with right-sided sciatica   He underwent a left discectomy and hemilaminotomy at L5-S1 on 11/11/2012  - cyclobenzaprine  (FLEXERIL ) 10 MG tablet; Take 1 tablet (10 mg total) by mouth 2 (two) times daily as needed for muscle spasms.  Dispense: 30 tablet; Refill: 0 - ibuprofen  (ADVIL ) 600 MG tablet; Take 1 tablet (600 mg total) by mouth every 8 (eight) hours as needed.  Dispense: 30 tablet; Refill: 0 Starting gabapentin  300 mg 3 times daily for neuropathy Take ibuprofen  with food and alternate with Tylenol  650 mg every 6 hours as needed, use of heating pad stretching exercises discussed      Relevant Medications   cyclobenzaprine  (FLEXERIL ) 10 MG tablet   ibuprofen  (ADVIL ) 600 MG tablet   gabapentin  (NEURONTIN ) 300 MG capsule     Other   Dyslipidemia, goal LDL below 70   Lab Results  Component Value Date   CHOL 154 07/29/2023   HDL 50 07/29/2023   LDLCALC 91 07/29/2023   TRIG 63 07/29/2023   CHOLHDL 3.1 07/29/2023  On atorvastatin  20 mg daily Checking lipid panel Today goal is less than 70       Meds ordered this encounter  Medications   cyclobenzaprine  (FLEXERIL ) 10 MG tablet    Sig: Take 1 tablet (10 mg  total) by mouth 2 (two) times daily as needed for muscle spasms.    Dispense:  30 tablet    Refill:  0   ibuprofen  (ADVIL ) 600 MG tablet    Sig: Take 1 tablet (600 mg total) by mouth every 8 (eight) hours as needed.    Dispense:  30 tablet    Refill:  0   gabapentin  (NEURONTIN ) 300 MG capsule    Sig: Take 1 capsule (300 mg total) by mouth 3 (three) times daily.    Dispense:  90 capsule    Refill:  3   glipiZIDE  (GLUCOTROL ) 5 MG tablet    Sig: Take 10 mg in the morning with breakfast and 5 mg with dinner    Dispense:  180 tablet    Refill:  1  Follow-up: Return in about 3 months (around 12/21/2023) for DM, HYPERLIPIDEMIA.    Haruo Stepanek R Leslieann Whisman, FNP

## 2023-09-20 NOTE — Progress Notes (Signed)
 Spoke with patient in-room after PCP appt with Fola Paseda, NP at [facility]. Patient has updated his phone number. Agreeable to pharmacy telephone follow-up in ~1 month. Will assess efficacy of increase in glipizide  vs transitioning to alternative treatment option like GLP-1RA.   Lorain Baseman, PharmD [facility] [phone]

## 2023-09-20 NOTE — Assessment & Plan Note (Signed)
 Feels better on gabapentin  100 mg 3 times daily Will increase to 300 mg 3 times daily

## 2023-09-20 NOTE — Assessment & Plan Note (Signed)
 Lab Results  Component Value Date   HGBA1C 8.4 (A) 09/20/2023  Much improved from 11.9   3 months ago Continue metformin  1000 mg twice daily, Jardiance  25 mg daily Start glipizide  10 mg with breakfast continue 5 mg with dinner Not starting a GLP 1 today due to his current BMI Has upcoming appointment with the clinical pharmacist . Patient counseled on low-carb diet

## 2023-09-20 NOTE — Assessment & Plan Note (Addendum)
 He underwent a left discectomy and hemilaminotomy at L5-S1 on 11/11/2012  - cyclobenzaprine  (FLEXERIL ) 10 MG tablet; Take 1 tablet (10 mg total) by mouth 2 (two) times daily as needed for muscle spasms.  Dispense: 30 tablet; Refill: 0 - ibuprofen  (ADVIL ) 600 MG tablet; Take 1 tablet (600 mg total) by mouth every 8 (eight) hours as needed.  Dispense: 30 tablet; Refill: 0 Starting gabapentin  300 mg 3 times daily for neuropathy Take ibuprofen  with food and alternate with Tylenol  650 mg every 6 hours as needed, use of heating pad stretching exercises discussed

## 2023-09-20 NOTE — Patient Instructions (Signed)
 Goal for fasting blood sugar ranges from 80 to 120 and 2 hours after any meal or at bedtime should be between 130 to 170.   Please take ibuprofen  with food alternate with Tylenol  650 mg every 6 hours as needed for pain.  Stretching exercises application of heating pad also encouraged   1. Type 2 diabetes mellitus with hyperglycemia, without long-term current use of insulin  (HCC) (Primary)  - POCT glycosylated hemoglobin (Hb A1C) - glipiZIDE  (GLUCOTROL ) 5 MG tablet; Take 10 mg in the morning with breakfast and 5 mg with dinner  Dispense: 180 tablet; Refill: 1 - Lipid panel - Basic Metabolic Panel  2. Chronic right-sided low back pain with right-sided sciatica  - cyclobenzaprine  (FLEXERIL ) 10 MG tablet; Take 1 tablet (10 mg total) by mouth 2 (two) times daily as needed for muscle spasms.  Dispense: 30 tablet; Refill: 0 - ibuprofen  (ADVIL ) 600 MG tablet; Take 1 tablet (600 mg total) by mouth every 8 (eight) hours as needed.  Dispense: 30 tablet; Refill: 0  3. Diabetic polyneuropathy associated with type 2 diabetes mellitus (HCC)  - gabapentin  (NEURONTIN ) 300 MG capsule; Take 1 capsule (300 mg total) by mouth 3 (three) times daily.  Dispense: 90 capsule; Refill: 3     It is important that you exercise regularly at least 30 minutes 5 times a week as tolerated  Think about what you will eat, plan ahead. Choose  clean, green, fresh or frozen over canned, processed or packaged foods which are more sugary, salty and fatty. 70 to 75% of food eaten should be vegetables and fruit. Three meals at set times with snacks allowed between meals, but they must be fruit or vegetables. Aim to eat over a 12 hour period , example 7 am to 7 pm, and STOP after  your last meal of the day. Drink water,generally about 64 ounces per day, no other drink is as healthy. Fruit juice is best enjoyed in a healthy way, by EATING the fruit.  Thanks for choosing [facility] we consider it a privelige to serve  you.

## 2023-09-20 NOTE — Assessment & Plan Note (Signed)
 Lab Results  Component Value Date   CHOL 154 07/29/2023   HDL 50 07/29/2023   LDLCALC 91 07/29/2023   TRIG 63 07/29/2023   CHOLHDL 3.1 07/29/2023  On atorvastatin  20 mg daily Checking lipid panel Today goal is less than 70

## 2023-09-21 LAB — LIPID PANEL

## 2023-09-22 LAB — LIPID PANEL
Cholesterol, Total: 111 mg/dL (ref 100–199)
HDL: 41 mg/dL (ref >39)
LDL CALC COMMENT:: 2.7 ratio (ref 0.0–5.0)
LDL Chol Calc (NIH): 52 mg/dL (ref 0–99)
Triglycerides: 92 mg/dL (ref 0–149)
VLDL Cholesterol Cal: 18 mg/dL (ref 5–40)

## 2023-09-22 LAB — CBC
Hematocrit: 44.6 % (ref 37.5–51.0)
Hemoglobin: 14.2 g/dL (ref 13.0–17.7)
MCH: 26.9 pg (ref 26.6–33.0)
MCHC: 31.8 g/dL (ref 31.5–35.7)
MCV: 85 fL (ref 79–97)
Platelets: 174 x10E3/uL (ref 150–450)
RBC: 5.27 x10E6/uL (ref 4.14–5.80)
RDW: 12.8 % (ref 11.6–15.4)
WBC: 7.5 x10E3/uL (ref 3.4–10.8)

## 2023-09-23 ENCOUNTER — Ambulatory Visit: Payer: Self-pay | Admitting: Nurse Practitioner

## 2023-09-29 ENCOUNTER — Encounter (HOSPITAL_COMMUNITY): Payer: Self-pay | Admitting: *Deleted

## 2023-09-29 ENCOUNTER — Ambulatory Visit (HOSPITAL_COMMUNITY)
Admission: EM | Admit: 2023-09-29 | Discharge: 2023-09-29 | Disposition: A | Discharge status: Home / Self Care | Attending: Family Medicine | Admitting: Family Medicine

## 2023-09-29 ENCOUNTER — Other Ambulatory Visit: Payer: Self-pay

## 2023-09-29 DIAGNOSIS — R21 Rash and other nonspecific skin eruption: Secondary | ICD-10-CM | POA: Diagnosis not present

## 2023-09-29 MED ORDER — TRIAMCINOLONE ACETONIDE 0.1 % EX CREA: 1.0000 | TOPICAL_CREAM | Freq: Two times a day (BID) | CUTANEOUS | 0 refills | Status: AC

## 2023-09-29 MED ORDER — CEPHALEXIN 500 MG PO CAPS: 500.0000 mg | ORAL_CAPSULE | Freq: Two times a day (BID) | ORAL | 0 refills | Status: AC

## 2023-09-29 NOTE — ED Provider Notes (Addendum)
 [facility]    CSN: [id number] Arrival date & time: 09/29/23  9173      History   Chief Complaint Chief Complaint  Patient presents with   Rash    HPI Noah Bates is a 49 y.o. male.    Rash  Here for red bumps on his lower legs and feet and ankles.  He first noticed them on July 21.  He just got back in town on July 20.  The bumps do not itch and they are not painful.  No edema in his feet or ankles.  No fever.  He has noted more bumps in the last day or so then were on their 3 days ago.  He has had hiccups reaction to a steroid shot previously  Past medical history significant for diabetes and his sugars have been good, 130 this morning.  He has tried applying some cortisone cream and it is not helped.   Past Medical History:  Diagnosis Date   Decreased libido 08/2019   Diabetes mellitus without complication (HCC)    Hyperlipidemia 05/2019   Hypertension    Vitamin D  deficiency 05/2019    Patient Active Problem List   Diagnosis Date Noted   Chronic right-sided low back pain with right-sided sciatica 09/20/2023   Gastroesophageal reflux disease 07/29/2023   Cough due to ACE inhibitor 10/18/2022   Erectile dysfunction 04/06/2022   Diabetic neuropathy (HCC) 03/09/2022   Dyslipidemia, goal LDL below 70 03/09/2022   Hemoglobin A1C greater than 9%, indicating poor diabetic control 06/01/2019   Hyperglycemia 06/01/2019   SIRS (systemic inflammatory response syndrome) (HCC) 08/31/2017   Essential hypertension 08/31/2017   Type 2 diabetes mellitus with hyperglycemia (HCC) 08/31/2017    Past Surgical History:  Procedure Laterality Date   BACK SURGERY         Home Medications    Prior to Admission medications   Medication Sig Start Date End Date Taking? Authorizing Provider  amLODipine  (NORVASC ) 10 MG tablet Take 1 tablet (10 mg total) by mouth daily. 07/18/23  Yes Paseda, Folashade R, FNP  aspirin EC 81 MG tablet Take 81 mg by mouth  daily.   Yes [provider]  atorvastatin  (LIPITOR) 20 MG tablet Take 1 tablet (20 mg total) by mouth daily. 08/02/23 08/01/24 Yes Paseda, Folashade R, FNP  cephALEXin  (KEFLEX ) 500 MG capsule Take 1 capsule (500 mg total) by mouth 2 (two) times daily for 7 days. 09/29/23 10/06/23 Yes Burkley Dech, Sharlet POUR, MD  cyclobenzaprine  (FLEXERIL ) 10 MG tablet Take 1 tablet (10 mg total) by mouth 2 (two) times daily as needed for muscle spasms. 09/20/23  Yes Paseda, Folashade R, FNP  empagliflozin  (JARDIANCE ) 25 MG TABS tablet Take 1 tablet (25 mg total) by mouth daily before breakfast. 07/29/23  Yes Paseda, Folashade R, FNP  famotidine  (PEPCID ) 20 MG tablet Take 1 tablet (20 mg total) by mouth 2 (two) times daily as needed for heartburn or indigestion. 07/29/23  Yes Paseda, Folashade R, FNP  gabapentin  (NEURONTIN ) 300 MG capsule Take 1 capsule (300 mg total) by mouth 3 (three) times daily. 09/20/23  Yes Paseda, Folashade R, FNP  glipiZIDE  (GLUCOTROL ) 5 MG tablet Take 10 mg in the morning with breakfast and 5 mg with dinner 09/20/23  Yes Paseda, Folashade R, FNP  hydrochlorothiazide  (HYDRODIURIL ) 25 MG tablet Take 1 tablet (25 mg total) by mouth daily. 05/23/23  Yes Paseda, Folashade R, FNP  metFORMIN  (GLUCOPHAGE ) 1000 MG tablet Take 1 tablet (1,000 mg total) by mouth 2 (  two) times daily with a meal. 05/23/23  Yes Paseda, Folashade R, FNP  Multiple Vitamin (MULTIVITAMIN) tablet Take 1 tablet by mouth daily.   Yes [provider]  sertraline (ZOLOFT) 25 MG tablet Take 25 mg by mouth daily. 03/20/23  Yes [provider]  triamcinolone  cream (KENALOG ) 0.1 % Apply 1 Application topically 2 (two) times daily. To affected area till better 09/29/23  Yes Tremeka Helbling, Sharlet POUR, MD  valsartan  (DIOVAN ) 80 MG tablet Take 1 tablet (80 mg total) by mouth daily. 05/23/23  Yes Paseda, Folashade R, FNP  Accu-Chek Softclix Lancets lancets Use as instructed to check blood sugar twice daily. 06/21/23   Paseda, Folashade R,  FNP  Blood Glucose Monitoring Suppl (ACCU-CHEK GUIDE) w/Device KIT 1 each by Does not apply route in the morning and at bedtime. 04/08/22   Paseda, Folashade R, FNP  glucose blood (ACCU-CHEK GUIDE TEST) test strip USE 1 STRIP TO CHECK GLUCOSE TWICE DAILY AS DIRECTED 07/19/23   Paseda, Folashade R, FNP  ibuprofen  (ADVIL ) 600 MG tablet Take 1 tablet (600 mg total) by mouth every 8 (eight) hours as needed. 09/20/23   Paseda, Folashade R, FNP  levocetirizine (XYZAL ) 5 MG tablet Take 1 tablet (5 mg total) by mouth every evening. Patient not taking: Reported on 03/31/2023 01/28/21   Shannan Sia I, NP  tadalafil (CIALIS) 20 MG tablet Take 10 mg by mouth daily as needed for erectile dysfunction. 03/18/23   [provider]    Family History Family History  Problem Relation Age of Onset   Diabetes Mellitus II Mother    Diabetes Mellitus II Father    Stroke Father    Cancer - Colon Neg Hx     Social History Social History   Tobacco Use   Smoking status: Never   Smokeless tobacco: Never  Vaping Use   Vaping status: Never Used  Substance Use Topics   Alcohol use: Not Currently   Drug use: Never     Allergies   Other   Review of Systems Review of Systems  Skin:  Positive for rash.     Physical Exam Triage Vital Signs ED Triage Vitals  Encounter Vitals Group     BP 09/29/23 0911 (!) 158/89     Girls Systolic BP Percentile --      Girls Diastolic BP Percentile --      Boys Systolic BP Percentile --      Boys Diastolic BP Percentile --      Pulse Rate 09/29/23 0911 81     Resp 09/29/23 0911 16     Temp 09/29/23 0911 98.2 F (36.8 C)     Temp Source 09/29/23 0911 Oral     SpO2 09/29/23 0911 96 %     Weight --      Height --      Head Circumference --      Peak Flow --      Pain Score 09/29/23 0913 0     Pain Loc --      Pain Education --      Exclude from Growth Chart --    No data found.  Updated Vital Signs BP (!) 158/89   Pulse 81   Temp 98.2 F (36.8  C) (Oral)   Resp 16   SpO2 96%   Visual Acuity Right Eye Distance:   Left Eye Distance:   Bilateral Distance:    Right Eye Near:   Left Eye Near:    Bilateral Near:  Physical Exam Vitals reviewed.  Constitutional:      General: He is not in acute distress.    Appearance: He is not toxic-appearing.  Musculoskeletal:     Right lower leg: No edema.     Left lower leg: No edema.  Skin:    Coloration: Skin is not jaundiced or pale.     Comments: There are scattered erythematous bumps, about 1 to 2 mm in size, over his anterior and medial and lateral lower legs and ankles and dorsum of both feet.  There is no drainage.  There are no bumps on the soles of his feet.  Neurological:     General: No focal deficit present.     Mental Status: He is alert and oriented to person, place, and time.  Psychiatric:        Behavior: Behavior normal.        UC Treatments / Results  Labs (all labs ordered are listed, but only abnormal results are displayed) Labs Reviewed - No data to display  EKG   Radiology No results found.  Procedures Procedures (including critical care time)  Medications Ordered in UC Medications - No data to display  Initial Impression / Assessment and Plan / UC Course  I have reviewed the triage vital signs and the nursing notes.  Pertinent labs & imaging results that were available during my care of the patient were reviewed by me and considered in my medical decision making (see chart for details).     I have discussed with the patient that I do not think this is typical for fleabites or other dermatitis, with them not itching.  Keflex  is sent in for possible folliculitis, though he has not been in a hot tub recently.  Topical triamcinolone  is also sent into apply to the bumps/rash Final Clinical Impressions(s) / UC Diagnoses   Final diagnoses:  Rash     Discharge Instructions      Cephalexin  500 mg --1 tablet by mouth 2 times daily  for 7 days.  Triamcinolone  cream--apply 2 times daily to the rash area until better, about 10 to 14 days.  Please follow-up with your primary care     ED Prescriptions     Medication Sig Dispense Auth. Provider   cephALEXin  (KEFLEX ) 500 MG capsule Take 1 capsule (500 mg total) by mouth 2 (two) times daily for 7 days. 14 capsule Vonna Sharlet POUR, MD   triamcinolone  cream (KENALOG ) 0.1 % Apply 1 Application topically 2 (two) times daily. To affected area till better 80 g Vonna Sharlet POUR, MD      PDMP not reviewed this encounter.   Vonna Sharlet POUR, MD 09/29/23 9045    Vonna Sharlet POUR, MD 09/29/23 [id number]

## 2023-09-29 NOTE — ED Triage Notes (Signed)
 C/O non-pruritic bumps to bilat feet & ankles onset 2 days ago. Has been applying cortisone eczema cream and lotion.

## 2023-09-29 NOTE — Medical Student Note (Incomplete)
 [facility] [employer] account manager Note For educational purposes for Medical, PA and NP students only and not part of the legal medical record.   CSN: [id number] Arrival date & time: 09/29/23  9173      History   Chief Complaint Chief Complaint  Patient presents with  . Rash    HPI Noah Bates is a 48 y.o. male.  Patient presents today with rash to bilateral lower extremities, initially noticed Tues 7/22 when rubbing lotion on legs.  No known trauma, injury or new exposure. Hx of Diabetes w/ mild neuropathy.  Patient states rash does not itch or hurt. Tried to Cortisone eczema lotion w/ no improvement.   Denies chest pain, shortness of breath, nausea, vomiting, diarrhea, cough, fever, chills, abdominal pain, back pain.     Rash   Past Medical History:  Diagnosis Date  . Decreased libido 08/2019  . Diabetes mellitus without complication (HCC)   . Hyperlipidemia 05/2019  . Hypertension   . Vitamin D  deficiency 05/2019    Patient Active Problem List   Diagnosis Date Noted  . Chronic right-sided low back pain with right-sided sciatica 09/20/2023  . Gastroesophageal reflux disease 07/29/2023  . Cough due to ACE inhibitor 10/18/2022  . Erectile dysfunction 04/06/2022  . Diabetic neuropathy (HCC) 03/09/2022  . Dyslipidemia, goal LDL below 70 03/09/2022  . Hemoglobin A1C greater than 9%, indicating poor diabetic control 06/01/2019  . Hyperglycemia 06/01/2019  . SIRS (systemic inflammatory response syndrome) (HCC) 08/31/2017  . Essential hypertension 08/31/2017  . Type 2 diabetes mellitus with hyperglycemia (HCC) 08/31/2017    Past Surgical History:  Procedure Laterality Date  . BACK SURGERY         Home Medications    Prior to Admission medications   Medication Sig Start Date End Date Taking? Authorizing Provider  amLODipine  (NORVASC ) 10 MG tablet Take 1 tablet (10 mg total) by mouth daily. 07/18/23  Yes Paseda, Folashade R, FNP  aspirin EC 81  MG tablet Take 81 mg by mouth daily.   Yes [provider]  atorvastatin  (LIPITOR) 20 MG tablet Take 1 tablet (20 mg total) by mouth daily. 08/02/23 08/01/24 Yes Paseda, Folashade R, FNP  cyclobenzaprine  (FLEXERIL ) 10 MG tablet Take 1 tablet (10 mg total) by mouth 2 (two) times daily as needed for muscle spasms. 09/20/23  Yes Paseda, Folashade R, FNP  empagliflozin  (JARDIANCE ) 25 MG TABS tablet Take 1 tablet (25 mg total) by mouth daily before breakfast. 07/29/23  Yes Paseda, Folashade R, FNP  famotidine  (PEPCID ) 20 MG tablet Take 1 tablet (20 mg total) by mouth 2 (two) times daily as needed for heartburn or indigestion. 07/29/23  Yes Paseda, Folashade R, FNP  gabapentin  (NEURONTIN ) 300 MG capsule Take 1 capsule (300 mg total) by mouth 3 (three) times daily. 09/20/23  Yes Paseda, Folashade R, FNP  glipiZIDE  (GLUCOTROL ) 5 MG tablet Take 10 mg in the morning with breakfast and 5 mg with dinner 09/20/23  Yes Paseda, Folashade R, FNP  hydrochlorothiazide  (HYDRODIURIL ) 25 MG tablet Take 1 tablet (25 mg total) by mouth daily. 05/23/23  Yes Paseda, Folashade R, FNP  metFORMIN  (GLUCOPHAGE ) 1000 MG tablet Take 1 tablet (1,000 mg total) by mouth 2 (two) times daily with a meal. 05/23/23  Yes Paseda, Folashade R, FNP  Multiple Vitamin (MULTIVITAMIN) tablet Take 1 tablet by mouth daily.   Yes [provider]  sertraline (ZOLOFT) 25 MG tablet Take 25 mg by mouth daily. 03/20/23  Yes [provider]  valsartan  (  DIOVAN ) 80 MG tablet Take 1 tablet (80 mg total) by mouth daily. 05/23/23  Yes Paseda, Folashade R, FNP  Accu-Chek Softclix Lancets lancets Use as instructed to check blood sugar twice daily. 06/21/23   Paseda, Folashade R, FNP  Blood Glucose Monitoring Suppl (ACCU-CHEK GUIDE) w/Device KIT 1 each by Does not apply route in the morning and at bedtime. 04/08/22   Paseda, Folashade R, FNP  glucose blood (ACCU-CHEK GUIDE TEST) test strip USE 1 STRIP TO CHECK GLUCOSE TWICE DAILY AS DIRECTED 07/19/23    Paseda, Folashade R, FNP  ibuprofen  (ADVIL ) 600 MG tablet Take 1 tablet (600 mg total) by mouth every 8 (eight) hours as needed. 09/20/23   Paseda, Folashade R, FNP  levocetirizine (XYZAL ) 5 MG tablet Take 1 tablet (5 mg total) by mouth every evening. Patient not taking: Reported on 03/31/2023 01/28/21   Shannan Sia I, NP  tadalafil (CIALIS) 20 MG tablet Take 10 mg by mouth daily as needed for erectile dysfunction. 03/18/23   [provider]    Family History Family History  Problem Relation Age of Onset  . Diabetes Mellitus II Mother   . Diabetes Mellitus II Father   . Stroke Father   . Cancer - Colon Neg Hx     Social History Social History   Tobacco Use  . Smoking status: Never  . Smokeless tobacco: Never  Vaping Use  . Vaping status: Never Used  Substance Use Topics  . Alcohol use: Not Currently  . Drug use: Never     Allergies   Other   Review of Systems Review of Systems  Skin:  Positive for rash.   Per HPI   Physical Exam Updated Vital Signs BP (!) 158/89   Pulse 81   Temp 98.2 F (36.8 C) (Oral)   Resp 16   SpO2 96%   Physical Exam Vitals and nursing note reviewed.  Constitutional:      Appearance: Normal appearance.  HENT:     Right Ear: Tympanic membrane normal.     Left Ear: Tympanic membrane normal.     Mouth/Throat:     Mouth: Mucous membranes are moist.  Eyes:     Pupils: Pupils are equal, round, and reactive to light.  Cardiovascular:     Rate and Rhythm: Normal rate and regular rhythm.     Pulses: Normal pulses.          Dorsalis pedis pulses are 2+ on the right side and 2+ on the left side.     Heart sounds: Normal heart sounds.  Pulmonary:     Effort: Pulmonary effort is normal.     Breath sounds: Normal breath sounds.  Skin:    General: Skin is warm and dry.     Capillary Refill: Capillary refill takes less than 2 seconds.     Findings: Rash present. Rash is papular.     Comments: Scattered papular rash noted to  bilateral lower extremities   Neurological:     Mental Status: He is alert.  Psychiatric:        Behavior: Behavior normal.      ED Treatments / Results  Labs (all labs ordered are listed, but only abnormal results are displayed) Labs Reviewed - No data to display  EKG  Radiology No results found.  Procedures Procedures (including critical care time)  Medications Ordered in ED Medications - No data to display   Initial Impression / Assessment and Plan / ED Course  I have reviewed the  triage vital signs and the nursing notes.  Pertinent labs & imaging results that were available during my care of the patient were reviewed by me and considered in my medical decision making (see chart for details).     ***  Final Clinical Impressions(s) / ED Diagnoses   Final diagnoses:  None    New Prescriptions New Prescriptions   No medications on file

## 2023-09-29 NOTE — Discharge Instructions (Signed)
 Cephalexin  500 mg --1 tablet by mouth 2 times daily for 7 days.  Triamcinolone  cream--apply 2 times daily to the rash area until better, about 10 to 14 days.  Please follow-up with your primary care

## 2023-10-16 ENCOUNTER — Other Ambulatory Visit: Payer: Self-pay | Admitting: Nurse Practitioner

## 2023-10-16 DIAGNOSIS — I10 Essential (primary) hypertension: Secondary | ICD-10-CM

## 2023-10-18 NOTE — Progress Notes (Signed)
 10/19/2023 Name: Noah Bates MRN: [id number] DOB: January 07, 1976  Chief Complaint  Patient presents with   Diabetes    Noah Bates is a 48 y.o. year old male who presented for a telephone visit.   They were referred to the pharmacist by their PCP for assistance in managing diabetes. PMH includes HTN, GERD, T2DM, HLD.    Subjective: Patient was last seen by PCP, Lorice Shall, NP on 09/20/23. At last visit, A1C had improved from 11.9% to 8.4% on glipizide , metformin , and Jardiance . He was instructed to increase glipizide  to 10 mg with breakfast and 5 mg with dinner. He was not started on a GLP-1RA at this time due to his BMI being WNL.   Today, patient reports doing well. He is taking his medications as prescribed.    Care Team: Primary Care Provider: Paseda, Folashade R, FNP ; Next Scheduled Visit: 12/21/23   Medication Access/Adherence  Current Pharmacy:  [id number] - [place] ([place]), [place] - [address] [place] ([place]) [place] [address] Phone: [phone] Fax: [phone]   Patient reports affordability concerns with their medications: No  Patient reports access/transportation concerns to their pharmacy: No  Patient reports adherence concerns with their medications:  No     Diabetes:  Current medications: glipizide  5 mg with breakfast and 10 mg with dinner, metformin  IR 1000 mg BID, Jardiance  25 mg daily Medications tried in the past: N/A  Current glucose readings:  Using Accu Chek Guide meter; testing checking every other day: Averages 130-145 mg/dL before meals, 849-839 mg/dL after meals (about 8-8.4 hours after meals). He does not have his glucometer with him to review specific values.  Patient reports hypoglycemic s/sx including dizziness, shakiness, sweating - since increasing glipizide . Reports this has happened twice. Checked BG during one of these episodes and it was 85 mg/dL.  Patient denies hyperglycemic  symptoms including polyuria, polydipsia, polyphagia, nocturia, neuropathy, blurred vision.  Current meal patterns: Typically only eats 1-2 meals/day. Minimal snacking.  - Supper: Protein includes - chicken, fish, seafood. Last night had homemade chicken salad. Occasional servings of rice or pasta - Snacks: olives, cashews, trail mix - Drinks: water or zero soda  Current physical activity: no current physical activity right now.  Current medication access support: Medicaid insurance   Objective:  BP Readings from Last 3 Encounters:  09/29/23 (!) 158/89  09/20/23 130/66  07/29/23 128/69    Lab Results  Component Value Date   HGBA1C 8.4 (A) 09/20/2023   HGBA1C 11.9 (A) 05/23/2023   HGBA1C 7.9 (A) 10/18/2022       Latest Ref Rng & Units 07/29/2023    3:24 PM 03/29/2023   12:58 PM 10/18/2022    3:13 PM  BMP  Glucose 70 - 99 mg/dL 99  692  742   BUN 6 - 24 mg/dL 15  13  19    Creatinine 0.76 - 1.27 mg/dL 8.96  9.13  8.83   BUN/Creat Ratio 9 - 20 15   16    Sodium 134 - 144 mmol/L 142  138  141   Potassium 3.5 - 5.2 mmol/L 4.5  4.0  4.0   Chloride 96 - 106 mmol/L 101  101  99   CO2 20 - 29 mmol/L 23  28  27    Calcium  8.7 - 10.2 mg/dL 89.9  9.3  9.4     Lab Results  Component Value Date   CHOL 111 09/20/2023   HDL 41 09/20/2023   LDLCALC  52 09/20/2023   TRIG 92 09/20/2023   CHOLHDL 2.7 09/20/2023    Medications Reviewed Today     Reviewed by Brinda Lorain SQUIBB, RPH (Pharmacist) on 10/19/23 at 1210  Med List Status: <None>   Medication Order Taking? Sig Documenting Provider Last Dose Status Informant  Accu-Chek Softclix Lancets lancets [id number]  Use as instructed to check blood sugar twice daily. Paseda, Folashade R, FNP  Active            Med Note MAYER, [facility] R   Tue Jul 12, 2023 11:42 AM) Not taking - ran out of test strips and pharmacy sent request last week   amLODipine  (NORVASC ) 10 MG tablet [id number] Yes Take 1 tablet (10 mg total) by mouth daily. Paseda, Folashade  R, FNP  Active   aspirin EC 81 MG tablet [id number]  Take 81 mg by mouth daily. [provider]  Active Self  atorvastatin  (LIPITOR) 20 MG tablet [id number] Yes Take 1 tablet (20 mg total) by mouth daily. Paseda, Folashade R, FNP  Active   Blood Glucose Monitoring Suppl (ACCU-CHEK GUIDE) w/Device KIT [id number]  1 each by Does not apply route in the morning and at bedtime. Paseda, Folashade R, FNP  Active            Med Note MAYER, [facility] R   Tue Jul 12, 2023 11:42 AM) Ran out of test strips  cyclobenzaprine  (FLEXERIL ) 10 MG tablet [id number]  Take 1 tablet (10 mg total) by mouth 2 (two) times daily as needed for muscle spasms. Paseda, Folashade R, FNP  Active   empagliflozin  (JARDIANCE ) 25 MG TABS tablet [id number] Yes Take 1 tablet (25 mg total) by mouth daily before breakfast. Paseda, Folashade R, FNP  Active   famotidine  (PEPCID ) 20 MG tablet [id number] Yes Take 1 tablet (20 mg total) by mouth 2 (two) times daily as needed for heartburn or indigestion. Paseda, Folashade R, FNP  Active            Med Note ARNELL, CHRISTINA L   Thu Sep 29, 2023  9:14 AM) prn  gabapentin  (NEURONTIN ) 300 MG capsule [id number] Yes Take 1 capsule (300 mg total) by mouth 3 (three) times daily. Paseda, Folashade R, FNP  Active   glipiZIDE  (GLUCOTROL ) 5 MG tablet [id number] Yes Take 10 mg in the morning with breakfast and 5 mg with dinner Paseda, Folashade R, FNP  Active   glucose blood (ACCU-CHEK GUIDE TEST) test strip [id number]  USE 1 STRIP TO CHECK GLUCOSE TWICE DAILY AS DIRECTED Paseda, Folashade R, FNP  Active   hydrochlorothiazide  (HYDRODIURIL ) 25 MG tablet [id number] Yes Take 1 tablet (25 mg total) by mouth daily. Paseda, Folashade R, FNP  Active   ibuprofen  (ADVIL ) 600 MG tablet [id number]  Take 1 tablet (600 mg total) by mouth every 8 (eight) hours as needed. Paseda, Folashade R, FNP  Active   levocetirizine (XYZAL ) 5 MG tablet [id number]  Take 1 tablet (5 mg total) by mouth every evening.  Patient not taking:  Reported on 03/31/2023   Shannan Sia I, NP  Active   metFORMIN  (GLUCOPHAGE ) 1000 MG tablet [id number] Yes Take 1 tablet (1,000 mg total) by mouth 2 (two) times daily with a meal. Paseda, Folashade R, FNP  Active   Multiple Vitamin (MULTIVITAMIN) tablet [id number]  Take 1 tablet by mouth daily. [provider]  Active   sertraline (ZOLOFT) 25 MG tablet [id number] Yes Take 25 mg by mouth daily. [provider]  Active   tadalafil (CIALIS) 20 MG  tablet [id number]  Take 10 mg by mouth daily as needed for erectile dysfunction. [provider]  Active   triamcinolone  cream (KENALOG ) 0.1 % [id number]  Apply 1 Application topically 2 (two) times daily. To affected area till better Vonna Sharlet POUR, MD  Active   valsartan  (DIOVAN ) 80 MG tablet [id number] Yes Take 1 tablet (80 mg total) by mouth daily. Paseda, Folashade R, FNP  Active               Assessment/Plan:    Diabetes: - Currently uncontrolled with most recent A1C of 8.4% above goal <7%, but much improved from 11.9% previously. Medication adherence appears appropriate. Patient is experiencing s/sx of hypoglycemia on high-dose sulfonylurea, glipizide . Would prefer to transition him to GLP-1RA Trulicity  to decrease risk of hypoglycemia and promote long term benefits such as CV risk reduction and kidney protection. BMI is WNL, however patient reports having a consistent appetite, and as a less potent GLP-1RA, Trulicity  would be appropriate to initiate.  - Last UACR March 2025 17 mg/g (prev 39 mg/g) - Patient denies personal or family history of multiple endocrine neoplasia type 2, medullary thyroid cancer; personal history of pancreatitis or gallbladder disease. - Reviewed long term cardiovascular and renal outcomes of uncontrolled blood sugar - Reviewed goal A1c, goal fasting, and goal 2 hour post prandial glucose - Reviewed hypoglycemia management plan and the rule of 15 - Reviewed dietary modifications including   utilizing the healthy plate method, limiting portion size of carbohydrate foods, increasing intake of protein and non-starchy vegetables. Counseled patient to stay hydrated with water throughout the day. - Reviewed lifestyle modifications including: aiming for 150 minutes of moderate intensity exercise every week.  - Recommend to START Trulicity  0.75 mg once weekly. Patient was extensively educated on administration, AE, and storage. Will collaborate with patient advocate team to submit PA. - Recommend to DECREASE glipizde to 5 mg once daily with the first meal of the day after starting Trulicity .  - Recommend to continue metformin  IR 1000 mg BID and Jardiance  25 mg daily as prescribed. Can consider transitioning to combination pill to reduce pill burden at follow-up.   - Recommend to check glucose twice daily: fasting and 2-hr PPG . Counseled patient to bring glucometer or BG log to every appointment. Instructed to contact office if he continues to have s/sx of hypoglycemia with BG < 70 mg/dL.  - Next A1C due Oct 2025    Written patient instructions provided. Patient verbalized understanding of treatment plan.    Follow Up Plan:  Pharmacist telephone 11/18/23 PCP clinic visit 12/21/23   Lorain Baseman, PharmD [facility] [phone]

## 2023-10-19 ENCOUNTER — Other Ambulatory Visit (INDEPENDENT_AMBULATORY_CARE_PROVIDER_SITE_OTHER): Payer: Self-pay

## 2023-10-19 ENCOUNTER — Other Ambulatory Visit: Payer: Self-pay

## 2023-10-19 DIAGNOSIS — E1165 Type 2 diabetes mellitus with hyperglycemia: Secondary | ICD-10-CM

## 2023-10-19 MED ORDER — GLIPIZIDE 5 MG PO TABS: 5.0000 mg | ORAL_TABLET | Freq: Every day | ORAL | Status: DC

## 2023-10-19 MED ORDER — TRULICITY 0.75 MG/0.5ML ~~LOC~~ SOAJ: 0.7500 mg | SUBCUTANEOUS | 2 refills | Status: DC

## 2023-10-20 ENCOUNTER — Telehealth: Payer: Self-pay

## 2023-10-20 ENCOUNTER — Other Ambulatory Visit: Payer: Self-pay

## 2023-10-20 NOTE — Telephone Encounter (Signed)
 Pharmacy Patient Advocate Encounter   Received notification from CoverMyMeds that prior authorization for TRULICITY  is required/requested.   Insurance verification completed.   The patient is insured through [employer] .   Per test claim: PA required; PA submitted to above mentioned insurance via CoverMyMeds Key/confirmation #/EOC [id number] Status is pending

## 2023-11-18 ENCOUNTER — Telehealth: Payer: Self-pay

## 2023-11-18 ENCOUNTER — Other Ambulatory Visit: Payer: Self-pay

## 2023-11-18 NOTE — Progress Notes (Unsigned)
 11/18/2023 Name: Noah Bates MRN: [id number] DOB: [birth year]  No chief complaint on file.   Noah Bates is a 48 y.o. year old male who presented for a telephone visit.   They were referred to the pharmacist by their PCP for assistance in managing diabetes. PMH includes HTN, GERD, T2DM, HLD.    Subjective: Patient was last seen by PCP, Lorice Shall, NP on 09/20/23. At last visit, A1C had improved from 11.9% to 8.4% on glipizide , metformin , and Jardiance . He was instructed to increase glipizide  to 10 mg with breakfast and 5 mg with dinner. He was not started on a GLP-1RA at this time due to his BMI being WNL. He was outreached by pharmacy via telephone on 10/19/23 and reported taking medications as prescribed. FBG were slightly above goal 130-145 mg/dL and he had a few episodes of hypoglycemia since increasing glipizide . He was instructed to decrease glipizide  to 5 mg with the first meal of the day and start Trulicity  0.75 mg weekly.   Today, patient reports doing well. He is taking his medications as prescribed. ***   Care Team: Primary Care Provider: Paseda, Folashade R, FNP ; Next Scheduled Visit: 12/21/23   Medication Access/Adherence  Current Pharmacy:  [facility] - [place] ([place]), [place] - [address] [place] ([place]) [place] [address] Phone: [phone] Fax: [phone]   Patient reports affordability concerns with their medications: No  Patient reports access/transportation concerns to their pharmacy: No  Patient reports adherence concerns with their medications:  No     Diabetes:  Current medications: glipizide  5 mg with breakfast, metformin  IR 1000 mg BID, Jardiance  25 mg daily, Trulicity  0.75 mg weekly. Medications tried in the past: N/A  Current glucose readings:  Using Accu Chek Guide meter; testing checking every other day: Averages 130-145 mg/dL before meals, 849-839 mg/dL after meals (about 8-8.4 hours  after meals). He does not have his glucometer with him to review specific values.  Patient reports hypoglycemic s/sx including dizziness, shakiness, sweating - since increasing glipizide . Reports this has happened twice. Checked BG during one of these episodes and it was 85 mg/dL.  Patient denies hyperglycemic symptoms including polyuria, polydipsia, polyphagia, nocturia, neuropathy, blurred vision.  Current meal patterns: Typically only eats 1-2 meals/day. Minimal snacking.  - Supper: Protein includes - chicken, fish, seafood. Last night had homemade chicken salad. Occasional servings of rice or pasta - Snacks: olives, cashews, trail mix - Drinks: water or zero soda  Current physical activity: no current physical activity right now.  Current medication access support: Medicaid insurance   Objective:  BP Readings from Last 3 Encounters:  09/29/23 (!) 158/89  09/20/23 130/66  07/29/23 128/69    Lab Results  Component Value Date   HGBA1C 8.4 (A) 09/20/2023   HGBA1C 11.9 (A) 05/23/2023   HGBA1C 7.9 (A) 10/18/2022       Latest Ref Rng & Units 07/29/2023    3:24 PM 03/29/2023   12:58 PM 10/18/2022    3:13 PM  BMP  Glucose 70 - 99 mg/dL 99  692  742   BUN 6 - 24 mg/dL 15  13  19    Creatinine 0.76 - 1.27 mg/dL 8.96  9.13  8.83   BUN/Creat Ratio 9 - 20 15   16    Sodium 134 - 144 mmol/L 142  138  141   Potassium 3.5 - 5.2 mmol/L 4.5  4.0  4.0   Chloride 96 - 106 mmol/L 101  101  99   CO2 20 - 29 mmol/L 23  28  27    Calcium  8.7 - 10.2 mg/dL 89.9  9.3  9.4     Lab Results  Component Value Date   CHOL 111 09/20/2023   HDL 41 09/20/2023   LDLCALC 52 09/20/2023   TRIG 92 09/20/2023   CHOLHDL 2.7 09/20/2023    Medications Reviewed Today   Medications were not reviewed in this encounter       Assessment/Plan:    Diabetes: - Currently uncontrolled with most recent A1C of 8.4% above goal <7%, but much improved from 11.9% previously. Medication adherence appears  appropriate. Patient is experiencing s/sx of hypoglycemia on high-dose sulfonylurea, glipizide . Would prefer to transition him to GLP-1RA Trulicity  to decrease risk of hypoglycemia and promote long term benefits such as CV risk reduction and kidney protection. BMI is WNL, however patient reports having a inconsistent appetite, and as a less potent GLP-1RA, Trulicity  would be appropriate to initiate.  - Last UACR March 2025 17 mg/g (prev 39 mg/g) - Patient denies personal or family history of multiple endocrine neoplasia type 2, medullary thyroid cancer; personal history of pancreatitis or gallbladder disease. - Reviewed long term cardiovascular and renal outcomes of uncontrolled blood sugar - Reviewed goal A1c, goal fasting, and goal 2 hour post prandial glucose - Reviewed hypoglycemia management plan and the rule of 15 - Reviewed dietary modifications including  utilizing the healthy plate method, limiting portion size of carbohydrate foods, increasing intake of protein and non-starchy vegetables. Counseled patient to stay hydrated with water throughout the day. - Reviewed lifestyle modifications including: aiming for 150 minutes of moderate intensity exercise every week.  - Recommend to START Trulicity  0.75 mg once weekly. Patient was extensively educated on administration, AE, and storage. Will collaborate with patient advocate team to submit PA. - Recommend to DECREASE glipizde to 5 mg once daily with the first meal of the day after starting Trulicity .  - Recommend to continue metformin  IR 1000 mg BID and Jardiance  25 mg daily as prescribed. Can consider transitioning to combination pill to reduce pill burden at follow-up.   - Recommend to check glucose twice daily: fasting and 2-hr PPG . Counseled patient to bring glucometer or BG log to every appointment. Instructed to contact office if he continues to have s/sx of hypoglycemia with BG < 70 mg/dL.  - Next A1C due Oct 2025    Written patient  instructions provided. Patient verbalized understanding of treatment plan.    Follow Up Plan:  Pharmacist telephone 11/18/23 PCP clinic visit 12/21/23   Lorain Baseman, PharmD [facility] [phone]

## 2023-11-18 NOTE — Progress Notes (Signed)
 Attempted to contact patient for scheduled appointment for medication management. Left HIPAA compliant message for patient to return my call at their convenience.   Lorain Baseman, PharmD [facility] [phone]

## 2023-12-21 ENCOUNTER — Encounter: Payer: Self-pay | Admitting: Nurse Practitioner

## 2023-12-21 ENCOUNTER — Ambulatory Visit: Payer: Self-pay | Admitting: Nurse Practitioner

## 2023-12-21 VITALS — BP 126/71 | HR 87 | Wt 232.0 lb

## 2023-12-21 DIAGNOSIS — K219 Gastro-esophageal reflux disease without esophagitis: Secondary | ICD-10-CM | POA: Diagnosis not present

## 2023-12-21 DIAGNOSIS — Z23 Encounter for immunization: Secondary | ICD-10-CM | POA: Diagnosis not present

## 2023-12-21 DIAGNOSIS — I10 Essential (primary) hypertension: Secondary | ICD-10-CM | POA: Diagnosis not present

## 2023-12-21 DIAGNOSIS — E1165 Type 2 diabetes mellitus with hyperglycemia: Secondary | ICD-10-CM | POA: Diagnosis not present

## 2023-12-21 DIAGNOSIS — E785 Hyperlipidemia, unspecified: Secondary | ICD-10-CM

## 2023-12-21 LAB — POCT GLYCOSYLATED HEMOGLOBIN (HGB A1C): Hemoglobin A1C: 7.9 % — AB (ref 4.0–5.6)

## 2023-12-21 MED ORDER — TRULICITY 1.5 MG/0.5ML ~~LOC~~ SOAJ: 1.5000 mg | SUBCUTANEOUS | 0 refills | Status: DC

## 2023-12-21 MED ORDER — AMLODIPINE BESYLATE 10 MG PO TABS: 10.0000 mg | ORAL_TABLET | Freq: Every day | ORAL | 3 refills | Status: AC

## 2023-12-21 MED ORDER — HYDROCHLOROTHIAZIDE 25 MG PO TABS: 25.0000 mg | ORAL_TABLET | Freq: Every day | ORAL | 3 refills | Status: AC

## 2023-12-21 MED ORDER — VALSARTAN 80 MG PO TABS: 80.0000 mg | ORAL_TABLET | Freq: Every day | ORAL | 3 refills | Status: AC

## 2023-12-21 MED ORDER — EMPAGLIFLOZIN 25 MG PO TABS: 25.0000 mg | ORAL_TABLET | Freq: Every day | ORAL | 1 refills | Status: AC

## 2023-12-21 MED ORDER — METFORMIN HCL 1000 MG PO TABS: 1000.0000 mg | ORAL_TABLET | Freq: Two times a day (BID) | ORAL | 3 refills | Status: AC

## 2023-12-21 NOTE — Patient Instructions (Signed)
 Goal for fasting blood sugar ranges from 80 to 120 and 2 hours after any meal or at bedtime should be between 130 to 170.   1. Essential hypertension  - valsartan  (DIOVAN ) 80 MG tablet; Take 1 tablet (80 mg total) by mouth daily.  Dispense: 90 tablet; Refill: 3 - hydrochlorothiazide  (HYDRODIURIL ) 25 MG tablet; Take 1 tablet (25 mg total) by mouth daily.  Dispense: 90 tablet; Refill: 3 - amLODipine  (NORVASC ) 10 MG tablet; Take 1 tablet (10 mg total) by mouth daily.  Dispense: 90 tablet; Refill: 3  2. Type 2 diabetes mellitus with hyperglycemia, without long-term current use of insulin  (HCC) (Primary)  - metFORMIN  (GLUCOPHAGE ) 1000 MG tablet; Take 1 tablet (1,000 mg total) by mouth 2 (two) times daily with a meal.  Dispense: 180 tablet; Refill: 3 - POCT glycosylated hemoglobin (Hb A1C) - Lipid panel - Basic Metabolic Panel - Dulaglutide  (TRULICITY ) 1.5 MG/0.5ML SOAJ; Inject 1.5 mg into the skin once a week.  Dispense: 2 mL; Refill: 0  3. Need for influenza vaccination  - Flu vaccine trivalent PF, 6mos and older(Flulaval,Afluria,Fluarix,Fluzone)   It is important that you exercise regularly at least 30 minutes 5 times a week as tolerated  Think about what you will eat, plan ahead. Choose  clean, green, fresh or frozen over canned, processed or packaged foods which are more sugary, salty and fatty. 70 to 75% of food eaten should be vegetables and fruit. Three meals at set times with snacks allowed between meals, but they must be fruit or vegetables. Aim to eat over a 12 hour period , example 7 am to 7 pm, and STOP after  your last meal of the day. Drink water,generally about 64 ounces per day, no other drink is as healthy. Fruit juice is best enjoyed in a healthy way, by EATING the fruit.  Thanks for choosing [facility] we consider it a privelige to serve you.

## 2023-12-21 NOTE — Assessment & Plan Note (Signed)
 Managed with famotidine  20 mg twice daily as needed Continue current medication Avoid foods that triggers GERD symptoms

## 2023-12-21 NOTE — Progress Notes (Signed)
 Established Patient Office Visit  Subjective:  Patient ID: Noah Bates, male    DOB: [birth year]  Age: 48 y.o. MRN: [id number]  CC:  Chief Complaint  Patient presents with  . Diabetes  . Hyperlipidemia    fasting    HPI Discussed the use of AI scribe software for clinical note transcription with the patient, who gave verbal consent to proceed.  History of Present Illness Noah Bates is a 48 year old male with hypertension and type 2 diabetes who presents for a follow-up visit.  He takes amlodipine  10 mg daily, hydrochlorothiazide  25 mg daily, and valsartan  80 mg daily for hypertension. No side effects from these medications are reported.  For type 2 diabetes, he takes metformin  1000 mg twice daily with food, Trulicity  0.75 mg once a week, and Jardiance  25 mg daily. He recently transitioned from glipizide  to Trulicity  about a month ago without experiencing side effects such as nausea, vomiting, or abdominal pain. He has not been regularly monitoring his blood sugar levels at home.  He is on atorvastatin  20 mg daily. He has not eaten today.  For neuropathy, he takes gabapentin  300 mg three times daily. He confirms receiving a flu shot recently and has had an eye exam, which showed no retinopathy.  He uses medication for acid reflux as needed and avoids triggers such as spicy foods, which have caused incidents in the past week. No nausea, vomiting, or abdominal pain are reported.  Assessment and Plan Assessment & Plan    Past Medical History:  Diagnosis Date  . Decreased libido 08/2019  . Diabetes mellitus without complication (HCC)   . Hyperlipidemia 05/2019  . Hypertension   . Vitamin D  deficiency 05/2019    Past Surgical History:  Procedure Laterality Date  . BACK SURGERY      Family History  Problem Relation Age of Onset  . Diabetes Mellitus II Mother   . Diabetes Mellitus II Father   . Stroke Father   . Cancer - Colon Neg Hx     Social  History   Socioeconomic History  . Marital status: Married    Spouse name: Not on file  . Number of children: 4  . Years of education: Not on file  . Highest education level: Not on file  Occupational History  . Not on file  Tobacco Use  . Smoking status: Never  . Smokeless tobacco: Never  Vaping Use  . Vaping status: Never Used  Substance and Sexual Activity  . Alcohol use: Not Currently  . Drug use: Never  . Sexual activity: Not on file  Other Topics Concern  . Not on file  Social History Narrative   Lives with wife.    Social Drivers of Health   Financial Resource Strain: Medium Risk (09/14/2023)   Received from [facility]   Overall Financial Resource Strain (CARDIA)   . Difficulty of Paying Living Expenses: Somewhat hard  Food Insecurity: Food Insecurity Present (09/14/2023)   Received from [facility]   Hunger Vital Sign   . Within the past 12 months, you worried that your food would run out before you got the money to buy more.: Sometimes true   . Within the past 12 months, the food you bought just didn't last and you didn't have money to get more.: Often true  Transportation Needs: No Transportation Needs (09/14/2023)   Received from [facility]   [facility] - Transportation   . In  the past 12 months, has lack of transportation kept you from medical appointments or from getting medications?: No   . Lack of Transportation (Non-Medical): No  Physical Activity: Not on file  Stress: Not on file  Social Connections: Not on file  Intimate Partner Violence: Not At Risk (03/31/2023)   Humiliation, Afraid, Rape, and Kick questionnaire   . Fear of Current or Ex-Partner: No   . Emotionally Abused: No   . Physically Abused: No   . Sexually Abused: No    Outpatient Medications Prior to Visit  Medication Sig Dispense Refill  . Accu-Chek Softclix Lancets lancets Use as instructed to check blood sugar twice daily. 100 each 12   . aspirin EC 81 MG tablet Take 81 mg by mouth daily.    . atorvastatin  (LIPITOR) 20 MG tablet Take 1 tablet (20 mg total) by mouth daily. 90 tablet 1  . Blood Glucose Monitoring Suppl (ACCU-CHEK GUIDE) w/Device KIT 1 each by Does not apply route in the morning and at bedtime. 1 kit 0  . cyclobenzaprine  (FLEXERIL ) 10 MG tablet Take 1 tablet (10 mg total) by mouth 2 (two) times daily as needed for muscle spasms. 30 tablet 0  . famotidine  (PEPCID ) 20 MG tablet Take 1 tablet (20 mg total) by mouth 2 (two) times daily as needed for heartburn or indigestion. 60 tablet 3  . gabapentin  (NEURONTIN ) 300 MG capsule Take 1 capsule (300 mg total) by mouth 3 (three) times daily. 90 capsule 3  . glucose blood (ACCU-CHEK GUIDE TEST) test strip USE 1 STRIP TO CHECK GLUCOSE TWICE DAILY AS DIRECTED 100 each 1  . ibuprofen  (ADVIL ) 600 MG tablet Take 1 tablet (600 mg total) by mouth every 8 (eight) hours as needed. 30 tablet 0  . Multiple Vitamin (MULTIVITAMIN) tablet Take 1 tablet by mouth daily.    . sertraline (ZOLOFT) 25 MG tablet Take 25 mg by mouth daily.    . tadalafil (CIALIS) 20 MG tablet Take 10 mg by mouth daily as needed for erectile dysfunction.    . amLODipine  (NORVASC ) 10 MG tablet Take 1 tablet (10 mg total) by mouth daily. 90 tablet 3  . Dulaglutide  (TRULICITY ) 0.75 MG/0.5ML SOAJ Inject 0.75 mg into the skin once a week. 2 mL 2  . empagliflozin  (JARDIANCE ) 25 MG TABS tablet Take 1 tablet (25 mg total) by mouth daily before breakfast. 90 tablet 1  . hydrochlorothiazide  (HYDRODIURIL ) 25 MG tablet Take 1 tablet (25 mg total) by mouth daily. 90 tablet 3  . metFORMIN  (GLUCOPHAGE ) 1000 MG tablet Take 1 tablet (1,000 mg total) by mouth 2 (two) times daily with a meal. 180 tablet 3  . valsartan  (DIOVAN ) 80 MG tablet Take 1 tablet (80 mg total) by mouth daily. 90 tablet 3  . levocetirizine (XYZAL ) 5 MG tablet Take 1 tablet (5 mg total) by mouth every evening. (Patient not taking: Reported on 12/21/2023) 30  tablet 11  . triamcinolone  cream (KENALOG ) 0.1 % Apply 1 Application topically 2 (two) times daily. To affected area till better (Patient not taking: Reported on 12/21/2023) 80 g 0  . glipiZIDE  (GLUCOTROL ) 5 MG tablet Take 1 tablet (5 mg total) by mouth daily before breakfast. (Patient not taking: Reported on 12/21/2023)     No facility-administered medications prior to visit.    Allergies  Allergen Reactions  . Other     Had hiccup reaction following steroid shot in past    ROS Review of Systems  Constitutional:  Negative for appetite change, chills,  fatigue and fever.  HENT:  Negative for congestion, postnasal drip, rhinorrhea and sneezing.   Respiratory:  Negative for cough, shortness of breath and wheezing.   Cardiovascular:  Negative for chest pain, palpitations and leg swelling.  Gastrointestinal:  Negative for abdominal pain, constipation, nausea and vomiting.  Genitourinary:  Negative for difficulty urinating, dysuria, flank pain and frequency.  Musculoskeletal:  Negative for arthralgias, back pain, joint swelling and myalgias.  Skin:  Negative for color change, pallor, rash and wound.  Neurological:  Negative for dizziness, facial asymmetry, weakness, numbness and headaches.  Psychiatric/Behavioral:  Negative for behavioral problems, confusion, self-injury and suicidal ideas.       Objective:    Physical Exam Vitals and nursing note reviewed.  Constitutional:      General: He is not in acute distress.    Appearance: Normal appearance. He is not ill-appearing, toxic-appearing or diaphoretic.  Eyes:     General: No scleral icterus.       Right eye: No discharge.        Left eye: No discharge.     Extraocular Movements: Extraocular movements intact.     Conjunctiva/sclera: Conjunctivae normal.  Cardiovascular:     Rate and Rhythm: Normal rate and regular rhythm.     Pulses: Normal pulses.     Heart sounds: Normal heart sounds. No murmur heard.    No friction rub.  No gallop.  Pulmonary:     Effort: Pulmonary effort is normal. No respiratory distress.     Breath sounds: Normal breath sounds. No stridor. No wheezing, rhonchi or rales.  Chest:     Chest wall: No tenderness.  Abdominal:     General: There is no distension.     Palpations: Abdomen is soft.     Tenderness: There is no abdominal tenderness. There is no right CVA tenderness, left CVA tenderness or guarding.  Musculoskeletal:        General: No swelling, tenderness, deformity or signs of injury.     Right lower leg: No edema.     Left lower leg: No edema.  Skin:    General: Skin is warm and dry.     Capillary Refill: Capillary refill takes less than 2 seconds.     Coloration: Skin is not jaundiced or pale.     Findings: No bruising, erythema or lesion.  Neurological:     Mental Status: He is alert and oriented to person, place, and time.     Motor: No weakness.     Gait: Gait normal.  Psychiatric:        Mood and Affect: Mood normal.        Behavior: Behavior normal.        Thought Content: Thought content normal.        Judgment: Judgment normal.     BP 126/71   Pulse 87   Wt 232 lb (105.2 kg)   SpO2 98%   BMI 26.14 kg/m  Wt Readings from Last 3 Encounters:  12/21/23 232 lb (105.2 kg)  09/20/23 228 lb (103.4 kg)  07/29/23 231 lb (104.8 kg)    Lab Results  Component Value Date   TSH 2.060 01/28/2021   Lab Results  Component Value Date   WBC 7.5 09/20/2023   HGB 14.2 09/20/2023   HCT 44.6 09/20/2023   MCV 85 09/20/2023   PLT 174 09/20/2023   Lab Results  Component Value Date   NA 142 07/29/2023   K 4.5 07/29/2023   CO2 23  07/29/2023   GLUCOSE 99 07/29/2023   BUN 15 07/29/2023   CREATININE 1.03 07/29/2023   BILITOT 0.8 10/18/2022   ALKPHOS 63 10/18/2022   AST 13 10/18/2022   ALT 23 10/18/2022   PROT 6.9 10/18/2022   ALBUMIN 4.7 10/18/2022   CALCIUM  10.0 07/29/2023   ANIONGAP 9 03/29/2023   EGFR 89 07/29/2023   Lab Results  Component Value Date    CHOL 111 09/20/2023   Lab Results  Component Value Date   HDL 41 09/20/2023   Lab Results  Component Value Date   LDLCALC 52 09/20/2023   Lab Results  Component Value Date   TRIG 92 09/20/2023   Lab Results  Component Value Date   CHOLHDL 2.7 09/20/2023   Lab Results  Component Value Date   HGBA1C 7.9 (A) 12/21/2023      Assessment & Plan:   Problem List Items Addressed This Visit       Cardiovascular and Mediastinum   Essential hypertension    Blood pressure controlled at 126/71 mmHg. - Continue amlodipine  10 mg daily. - Continue hydrochlorothiazide  25 mg daily. - Continue valsartan  80 mg daily. - Advise heart-healthy, low salt, low fat diet. - Recommend 30 minutes moderate exercise five days a week.       Relevant Medications   valsartan  (DIOVAN ) 80 MG tablet   hydrochlorothiazide  (HYDRODIURIL ) 25 MG tablet   amLODipine  (NORVASC ) 10 MG tablet     Digestive   Gastroesophageal reflux disease   Managed with famotidine  20 mg twice daily as needed Continue current medication Avoid foods that triggers GERD symptoms        Endocrine   Type 2 diabetes mellitus with hyperglycemia (HCC) - Primary   Lab Results  Component Value Date   HGBA1C 7.9 (A) 12/21/2023   A1c improved from 8.4 to 7.9. Goal A1c <7 to reduce cardiovascular and renal risks. Trulicity  0.75 mg weekly tolerated. Glipizide  discontinued. - Increase Trulicity  to 1.5 mg weekly. - Advise occasional blood glucose monitoring, fasting 80-120 mg/dL, postprandial <829 mg/dL. - Continue metformin  1000 mg twice daily with food. - Continue Jardiance  25 mg daily. - Keep pharmacist appointment for Trulicity  evaluation. Up-to-date with diabetic eye exam stated no retinopathy, records requested Follow-up in 3 months      Relevant Medications   valsartan  (DIOVAN ) 80 MG tablet   metFORMIN  (GLUCOPHAGE ) 1000 MG tablet   Dulaglutide  (TRULICITY ) 1.5 MG/0.5ML SOAJ   empagliflozin  (JARDIANCE ) 25 MG TABS  tablet   Other Relevant Orders   POCT glycosylated hemoglobin (Hb A1C) (Completed)   Lipid panel   Basic Metabolic Panel     Other   Dyslipidemia, goal LDL below 70   Relevant Medications   valsartan  (DIOVAN ) 80 MG tablet   hydrochlorothiazide  (HYDRODIURIL ) 25 MG tablet   amLODipine  (NORVASC ) 10 MG tablet   empagliflozin  (JARDIANCE ) 25 MG TABS tablet   Other Visit Diagnoses       Need for influenza vaccination       Relevant Orders   Flu vaccine trivalent PF, 6mos and older(Flulaval,Afluria,Fluarix,Fluzone)       Meds ordered this encounter  Medications  . valsartan  (DIOVAN ) 80 MG tablet    Sig: Take 1 tablet (80 mg total) by mouth daily.    Dispense:  90 tablet    Refill:  3  . metFORMIN  (GLUCOPHAGE ) 1000 MG tablet    Sig: Take 1 tablet (1,000 mg total) by mouth 2 (two) times daily with a meal.    Dispense:  180 tablet  Refill:  3  . hydrochlorothiazide  (HYDRODIURIL ) 25 MG tablet    Sig: Take 1 tablet (25 mg total) by mouth daily.    Dispense:  90 tablet    Refill:  3  . amLODipine  (NORVASC ) 10 MG tablet    Sig: Take 1 tablet (10 mg total) by mouth daily.    Dispense:  90 tablet    Refill:  3  . Dulaglutide  (TRULICITY ) 1.5 MG/0.5ML SOAJ    Sig: Inject 1.5 mg into the skin once a week.    Dispense:  2 mL    Refill:  0  . empagliflozin  (JARDIANCE ) 25 MG TABS tablet    Sig: Take 1 tablet (25 mg total) by mouth daily before breakfast.    Dispense:  90 tablet    Refill:  1    Follow-up: Return in about 3 months (around 03/22/2024) for CPE.    Lahoma Constantin R Shyna Duignan, FNP

## 2023-12-21 NOTE — Assessment & Plan Note (Signed)
  Blood pressure controlled at 126/71 mmHg. - Continue amlodipine  10 mg daily. - Continue hydrochlorothiazide  25 mg daily. - Continue valsartan  80 mg daily. - Advise heart-healthy, low salt, low fat diet. - Recommend 30 minutes moderate exercise five days a week.

## 2023-12-21 NOTE — Assessment & Plan Note (Addendum)
 Lab Results  Component Value Date   HGBA1C 7.9 (A) 12/21/2023   A1c improved from 8.4 to 7.9. Goal A1c <7 to reduce cardiovascular and renal risks. Trulicity  0.75 mg weekly tolerated. Glipizide  discontinued. - Increase Trulicity  to 1.5 mg weekly. - Advise occasional blood glucose monitoring, fasting 80-120 mg/dL, postprandial <829 mg/dL. - Continue metformin  1000 mg twice daily with food. - Continue Jardiance  25 mg daily. - Keep pharmacist appointment for Trulicity  evaluation. Up-to-date with diabetic eye exam stated no retinopathy, records requested Follow-up in 3 months

## 2023-12-22 ENCOUNTER — Ambulatory Visit
Admission: EM | Admit: 2023-12-22 | Discharge: 2023-12-22 | Disposition: A | Discharge status: Home / Self Care | Attending: Family Medicine | Admitting: Family Medicine

## 2023-12-22 DIAGNOSIS — G8929 Other chronic pain: Secondary | ICD-10-CM

## 2023-12-22 DIAGNOSIS — M792 Neuralgia and neuritis, unspecified: Secondary | ICD-10-CM

## 2023-12-22 DIAGNOSIS — M25559 Pain in unspecified hip: Secondary | ICD-10-CM | POA: Insufficient documentation

## 2023-12-22 DIAGNOSIS — M545 Low back pain, unspecified: Secondary | ICD-10-CM | POA: Insufficient documentation

## 2023-12-22 LAB — LIPID PANEL
Chol/HDL Ratio: 2.3 ratio (ref 0.0–5.0)
Cholesterol, Total: 125 mg/dL (ref 100–199)
HDL: 54 mg/dL (ref >39)
LDL Chol Calc (NIH): 60 mg/dL (ref 0–99)
Triglycerides: 45 mg/dL (ref 0–149)
VLDL Cholesterol Cal: 11 mg/dL (ref 5–40)

## 2023-12-22 LAB — BASIC METABOLIC PANEL WITH GFR
BUN/Creatinine Ratio: 8 — ABNORMAL LOW (ref 9–20)
BUN: 8 mg/dL (ref 6–24)
CO2: 24 mmol/L (ref 20–29)
Calcium: 9.5 mg/dL (ref 8.7–10.2)
Chloride: 105 mmol/L (ref 96–106)
Creatinine, Ser: 1.03 mg/dL (ref 0.76–1.27)
Glucose: 140 mg/dL — ABNORMAL HIGH (ref 70–99)
Potassium: 4.5 mmol/L (ref 3.5–5.2)
Sodium: 142 mmol/L (ref 134–144)
eGFR: 89 mL/min/1.73 (ref >59)

## 2023-12-22 MED ORDER — IBUPROFEN 600 MG PO TABS: 600.0000 mg | ORAL_TABLET | Freq: Three times a day (TID) | ORAL | 0 refills | Status: AC | PRN

## 2023-12-22 MED ORDER — HYDROCODONE-ACETAMINOPHEN 5-325 MG PO TABS: 1.0000 | ORAL_TABLET | Freq: Four times a day (QID) | ORAL | 0 refills | Status: AC | PRN

## 2023-12-22 NOTE — ED Provider Notes (Signed)
 [facility]    CSN: [id number] Arrival date & time: 12/22/23  1858      History   Chief Complaint Chief Complaint  Patient presents with   Arm Pain    HPI Noah Bates is a 48 y.o. male.    Arm Pain   Here for arm pain.  Yesterday when he was getting blood drawn all of a sudden there was a shooting pain down his right arm to his hand and his hand and forearm went numb.  Since then it has been burning and tingling and painful.  It hurts for light touch on his forearm.  No fever and no swelling  He already takes gabapentin  300 mg 3 times daily for diabetic neuropathy.  NKDA   Past Medical History:  Diagnosis Date   Decreased libido 08/2019   Diabetes mellitus without complication (HCC)    Hyperlipidemia 05/2019   Hypertension    Vitamin D  deficiency 05/2019    Patient Active Problem List   Diagnosis Date Noted   Low back pain 12/22/2023   Hip pain 12/22/2023   Chronic right-sided low back pain with right-sided sciatica 09/20/2023   Gastroesophageal reflux disease 07/29/2023   Cough due to ACE inhibitor 10/18/2022   Erectile dysfunction 04/06/2022   Diabetic neuropathy (HCC) 03/09/2022   Dyslipidemia, goal LDL below 70 03/09/2022   Hemoglobin A1C greater than 9%, indicating poor diabetic control 06/01/2019   Hyperglycemia 06/01/2019   SIRS (systemic inflammatory response syndrome) (HCC) 08/31/2017   Type 2 diabetes mellitus with hyperglycemia (HCC) 08/31/2017   Hypertension 01/03/2013   Hyperlipidemia 01/03/2013   Type II diabetes mellitus (HCC) 01/03/2013    Past Surgical History:  Procedure Laterality Date   BACK SURGERY         Home Medications    Prior to Admission medications   Medication Sig Start Date End Date Taking? Authorizing Provider  HYDROcodone-acetaminophen  (NORCO/VICODIN) 5-325 MG tablet Take 1 tablet by mouth every 6 (six) hours as needed (pain). 12/22/23  Yes Vonna Sharlet POUR, MD  lisinopril  (ZESTRIL )  40 MG tablet Take 40 mg by mouth daily. 11/08/13  Yes [provider]  sildenafil  (VIAGRA ) 100 MG tablet Take 50-100 mg by mouth daily as needed. 09/28/23  Yes [provider]  Accu-Chek Softclix Lancets lancets Use as instructed to check blood sugar twice daily. 06/21/23   Paseda, Folashade R, FNP  amLODipine  (NORVASC ) 10 MG tablet Take 1 tablet (10 mg total) by mouth daily. 12/21/23   Paseda, Folashade R, FNP  aspirin EC 81 MG tablet Take 81 mg by mouth daily.    [provider]  atorvastatin  (LIPITOR) 20 MG tablet Take 1 tablet (20 mg total) by mouth daily. 08/02/23 08/01/24  Paseda, Folashade R, FNP  Blood Glucose Monitoring Suppl (ACCU-CHEK GUIDE) w/Device KIT 1 each by Does not apply route in the morning and at bedtime. 04/08/22   Paseda, Folashade R, FNP  cyclobenzaprine  (FLEXERIL ) 10 MG tablet Take 1 tablet (10 mg total) by mouth 2 (two) times daily as needed for muscle spasms. 09/20/23   Paseda, Folashade R, FNP  Dulaglutide  (TRULICITY ) 1.5 MG/0.5ML SOAJ Inject 1.5 mg into the skin once a week. 12/21/23   Paseda, Folashade R, FNP  empagliflozin  (JARDIANCE ) 25 MG TABS tablet Take 1 tablet (25 mg total) by mouth daily before breakfast. 12/21/23   Paseda, Folashade R, FNP  famotidine  (PEPCID ) 20 MG tablet Take 1 tablet (20 mg total) by mouth 2 (two) times daily as needed for heartburn  or indigestion. 07/29/23   Paseda, Folashade R, FNP  gabapentin  (NEURONTIN ) 300 MG capsule Take 1 capsule (300 mg total) by mouth 3 (three) times daily. 09/20/23   Paseda, Folashade R, FNP  glucose blood (ACCU-CHEK GUIDE TEST) test strip USE 1 STRIP TO CHECK GLUCOSE TWICE DAILY AS DIRECTED 07/19/23   Paseda, Folashade R, FNP  hydrochlorothiazide  (HYDRODIURIL ) 25 MG tablet Take 1 tablet (25 mg total) by mouth daily. 12/21/23   Paseda, Folashade R, FNP  ibuprofen  (ADVIL ) 600 MG tablet Take 1 tablet (600 mg total) by mouth every 8 (eight) hours as needed. 12/22/23   Waylen Depaolo K, MD   levocetirizine (XYZAL ) 5 MG tablet Take 1 tablet (5 mg total) by mouth every evening. Patient not taking: Reported on 03/31/2023 01/28/21   Shannan Sia I, NP  metFORMIN  (GLUCOPHAGE ) 1000 MG tablet Take 1 tablet (1,000 mg total) by mouth 2 (two) times daily with a meal. 12/21/23   Paseda, Folashade R, FNP  Multiple Vitamin (MULTIVITAMIN) tablet Take 1 tablet by mouth daily.    [provider]  sertraline (ZOLOFT) 25 MG tablet Take 25 mg by mouth daily. 03/20/23   [provider]  tadalafil (CIALIS) 20 MG tablet Take 10 mg by mouth daily as needed for erectile dysfunction. 03/18/23   [provider]  triamcinolone  cream (KENALOG ) 0.1 % Apply 1 Application topically 2 (two) times daily. To affected area till better Patient not taking: No sig reported 09/29/23   Vonna Sharlet POUR, MD  valsartan  (DIOVAN ) 80 MG tablet Take 1 tablet (80 mg total) by mouth daily. 12/21/23   Paseda, Folashade R, FNP    Family History Family History  Problem Relation Age of Onset   Diabetes Mellitus II Mother    Diabetes Mellitus II Father    Stroke Father    Cancer - Colon Neg Hx     Social History Social History   Tobacco Use   Smoking status: Never   Smokeless tobacco: Never  Vaping Use   Vaping status: Never Used  Substance Use Topics   Alcohol use: Not Currently   Drug use: Never     Allergies   Other   Review of Systems Review of Systems   Physical Exam Triage Vital Signs ED Triage Vitals  Encounter Vitals Group     BP 12/22/23 1929 (!) 155/80     Girls Systolic BP Percentile --      Girls Diastolic BP Percentile --      Boys Systolic BP Percentile --      Boys Diastolic BP Percentile --      Pulse Rate 12/22/23 1929 87     Resp 12/22/23 1929 18     Temp 12/22/23 1929 98.1 F (36.7 C)     Temp Source 12/22/23 1929 Oral     SpO2 12/22/23 1929 98 %     Weight 12/22/23 1927 233 lb (105.7 kg)     Height 12/22/23 1927 6' 7 (2.007 m)     Head  Circumference --      Peak Flow --      Pain Score 12/22/23 1924 6     Pain Loc --      Pain Education --      Exclude from Growth Chart --    No data found.  Updated Vital Signs BP (!) 155/80 (BP Location: Left Arm)   Pulse 87   Temp 98.1 F (36.7 C) (Oral)   Resp 18   Ht 6' 7 (2.007  m)   Wt 105.7 kg   SpO2 98%   BMI 26.25 kg/m   Visual Acuity Right Eye Distance:   Left Eye Distance:   Bilateral Distance:    Right Eye Near:   Left Eye Near:    Bilateral Near:     Physical Exam Vitals reviewed.  Constitutional:      General: He is not in acute distress.    Appearance: He is not ill-appearing, toxic-appearing or diaphoretic.  HENT:     Mouth/Throat:     Mouth: Mucous membranes are moist.  Eyes:     Extraocular Movements: Extraocular movements intact.     Conjunctiva/sclera: Conjunctivae normal.     Pupils: Pupils are equal, round, and reactive to light.  Musculoskeletal:        General: No swelling.     Comments: There is dysesthesia over the volar surface of the right forearm.  Pulses are normal and capillary refill is normal.  There is no swelling.  No deformity.  Skin:    Coloration: Skin is not jaundiced or pale.  Neurological:     Mental Status: He is alert and oriented to person, place, and time.  Psychiatric:        Behavior: Behavior normal.      UC Treatments / Results  Labs (all labs ordered are listed, but only abnormal results are displayed) Labs Reviewed - No data to display  EKG   Radiology No results found.  Procedures Procedures (including critical care time)  Medications Ordered in UC Medications - No data to display  Initial Impression / Assessment and Plan / UC Course  I have reviewed the triage vital signs and the nursing notes.  Pertinent labs & imaging results that were available during my care of the patient were reviewed by me and considered in my medical decision making (see chart for details).     Hydrocodone and  ibuprofen  600 mg are sent in for pain relief.  I have asked him to follow-up with his primary care as they make can adjust his gabapentin  in the short-term to help with symptoms. Final Clinical Impressions(s) / UC Diagnoses   Final diagnoses:  Neuropathic pain, arm     Discharge Instructions      Hydrocodone 5 mg--1 tablet every 6 hours as needed for pain.  This is best taken with food.  It can cause sleepiness or dizziness  Take ibuprofen  600 mg--1 tab every 8 hours as needed for pain.  Please call your primary care tomorrow.  They may be able to adjust your gabapentin  dose to help your symptoms.    ED Prescriptions     Medication Sig Dispense Auth. Provider   ibuprofen  (ADVIL ) 600 MG tablet Take 1 tablet (600 mg total) by mouth every 8 (eight) hours as needed. 15 tablet Wilmary Levit K, MD   HYDROcodone-acetaminophen  (NORCO/VICODIN) 5-325 MG tablet Take 1 tablet by mouth every 6 (six) hours as needed (pain). 12 tablet Atha Muradyan K, MD      I have reviewed the PDMP during this encounter.   Vonna Sharlet POUR, MD 12/22/23 [phone]

## 2023-12-22 NOTE — Discharge Instructions (Signed)
 Hydrocodone 5 mg--1 tablet every 6 hours as needed for pain.  This is best taken with food.  It can cause sleepiness or dizziness  Take ibuprofen  600 mg--1 tab every 8 hours as needed for pain.  Please call your primary care tomorrow.  They may be able to adjust your gabapentin  dose to help your symptoms.

## 2023-12-22 NOTE — ED Triage Notes (Addendum)
 Patient reports having had a doctor's appointment yesterday during which a blood draw was performed. During the procedure, the needle appeared to hit a nerve in the right antecubital/accessory vein area. The patient is now experiencing ongoing pain with radiation down the right arm and into the hand, accompanied by intermittent tingling. The incident was discussed with the provider and staff for documentation.  Of Note, Patient reports he has spoken to his provider's office a few times today who has insisted this is all documented and was advised if continued pain ED or Urgent Care.

## 2023-12-23 ENCOUNTER — Ambulatory Visit: Payer: Self-pay | Admitting: Nurse Practitioner

## 2023-12-26 NOTE — Telephone Encounter (Signed)
 Pt advised He did go to urgent care and pain med is not working . No signs of infection and will try the warm compress. KH

## 2024-01-03 ENCOUNTER — Telehealth (INDEPENDENT_AMBULATORY_CARE_PROVIDER_SITE_OTHER): Payer: Self-pay | Admitting: Nurse Practitioner

## 2024-01-03 DIAGNOSIS — M79601 Pain in right arm: Secondary | ICD-10-CM | POA: Insufficient documentation

## 2024-01-03 NOTE — Progress Notes (Signed)
 Virtual Visit via video note  I connected with Ajmal Kath @ on 01/03/24 at  1:40 PM EDT by video and verified that I am speaking with the correct person using two identifiers.  I spent 8 minutes on this video encounter  Location: Patient: home Provider: office   I discussed the limitations, risks, security and privacy concerns of performing an evaluation and management service by telephone and the availability of in person appointments. I also discussed with the patient that there may be a patient responsible charge related to this service. The patient expressed understanding and agreed to proceed.   History of Present Illness: Discussed the use of AI scribe software for clinical note transcription with the patient, who gave verbal consent to proceed.  History of Present Illness Noah Bates is a 48 year old male  has a past medical history of Decreased libido (08/2019), Diabetes mellitus without complication (HCC), Hyperlipidemia (05/2019), Hypertension, and Vitamin D  deficiency (05/2019).  who presents with persistent right arm pain following a blood draw.  He has been experiencing sharp, burning pain in his right arm since a blood draw approximately two weeks ago. The pain primarily occurs when attempting to fully extend the arm and radiates down to his hand. It is exacerbated by lifting heavy objects and is accompanied by numbness.  The pain has not improved over time despite taking gabapentin  300 mg twice daily, although he was advised to take it three times daily. Gabapentin  has not alleviated the pain. He was also prescribed Norco (Vicodin) and ibuprofen  at an urgent care visit, which did not provide relief. He has not yet tried using a warm compress as a treatment measure.       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 No infection signs. Differential includes nerve pain vs. blood clot. - Increase gabapentin  to 300 mg orally three times daily. - Advise warm compress on affected area. - Order ultrasound of right lower arm to rule out DVT     Follow Up Instructions:    I discussed the assessment and treatment plan with the patient. The patient was provided an opportunity to ask questions and all were answered. The patient agreed with the plan and demonstrated an understanding of the instructions.   The patient was advised to call back or seek an in-person evaluation if the symptoms worsen or if the condition fails to improve as anticipated.

## 2024-01-03 NOTE — Telephone Encounter (Signed)
 Lvm for pt to call back to be placed on the schedule. If he can come in this Friday in the hospital follow up slot. KH

## 2024-01-03 NOTE — Assessment & Plan Note (Signed)
 Right lower arm neuropathic pain after blood draw Persistent neuropathic pain post-blood draw, unresponsive to gabapentin . No infection signs. Differential includes nerve pain vs. blood clot. - Increase gabapentin  to 300 mg orally three times daily. - Advise warm compress on affected area. - Order ultrasound of right lower arm to rule out DVT

## 2024-01-06 ENCOUNTER — Other Ambulatory Visit: Payer: Self-pay

## 2024-01-06 ENCOUNTER — Other Ambulatory Visit

## 2024-01-06 ENCOUNTER — Telehealth: Payer: Self-pay

## 2024-01-06 DIAGNOSIS — E1165 Type 2 diabetes mellitus with hyperglycemia: Secondary | ICD-10-CM

## 2024-01-06 MED ORDER — TRULICITY 1.5 MG/0.5ML ~~LOC~~ SOAJ: 1.5000 mg | SUBCUTANEOUS | 2 refills | Status: AC

## 2024-01-06 NOTE — Progress Notes (Signed)
 Attempted to contact patient x2 for scheduled appointment for medication management. VM box not set up.   Noted that he has not yet filled higher strength of Trulicity  (1.5 mg weekly) yet. Resent to [facility] with refills, as patient is not scheduled to see PCP again until January. Will attempt to reschedule with pharmacy prior to that. A1C is improving. Can consider consolidating regimen with combination SGLT2i-metformin  at follow-up.   Lorain Baseman, PharmD [facility] [phone]

## 2024-01-06 NOTE — Progress Notes (Unsigned)
 01/06/2024 Name: Noah Bates MRN: [id number] DOB: 1975/10/23  No chief complaint on file.   Noah Bates is a 48 y.o. year old male who presented for a telephone visit.   They were referred to the pharmacist by their PCP for assistance in managing diabetes. PMH includes HTN, GERD, T2DM, HLD.    Subjective: Patient was last seen by PCP, Lorice Shall, NP on 09/20/23. At last visit, A1C had improved from 11.9% to 8.4% on glipizide , metformin , and Jardiance . He was instructed to increase glipizide  to 10 mg with breakfast and 5 mg with dinner. He was not started on a GLP-1RA at this time due to his BMI being WNL. He was outreached by pharmacy via telephone on 10/19/23 and reported taking medications as prescribed. FBG were slightly above goal 130-145 mg/dL and he had a few episodes of hypoglycemia since increasing glipizide . He was instructed to decrease glipizide  to 5 mg with the first meal of the day and start Trulicity  0.75 mg weekly. At PCP appt on 12/21/23, A1C had improved to 7.9%. BP was controlled. He was instructed to increase Trulicity  to 1.5 mg weekly.  Today, patient reports doing well. He is taking his medications as prescribed. ***  Combine Jardiance  and metformin ?   Care Team: Primary Care Provider: Paseda, Folashade R, FNP ; Next Scheduled Visit: 12/21/23   Medication Access/Adherence  Current Pharmacy:  [id number] - [place]), [place] - [address] [place]) [place] [address] Phone: [phone] Fax: [phone]  [id number] - [place], [place] - [address] [address] Phone: [phone] Fax: [phone]   Patient reports affordability concerns with their medications: No  Patient reports access/transportation concerns to their pharmacy: No  Patient reports adherence concerns with their medications:  No     Diabetes:  Current medications: glipizide  5 mg  with breakfast, metformin  IR 1000 mg BID, Jardiance  25 mg daily, Trulicity  0.75 mg weekly. Medications tried in the past: N/A  Current glucose readings:  Using Accu Chek Guide meter; testing checking every other day: Averages 130-145 mg/dL before meals, 849-839 mg/dL after meals (about 8-8.4 hours after meals). He does not have his glucometer with him to review specific values.  Patient reports hypoglycemic s/sx including dizziness, shakiness, sweating - since increasing glipizide . Reports this has happened twice. Checked BG during one of these episodes and it was 85 mg/dL.  Patient denies hyperglycemic symptoms including polyuria, polydipsia, polyphagia, nocturia, neuropathy, blurred vision.  Current meal patterns: Typically only eats 1-2 meals/day. Minimal snacking.  - Supper: Protein includes - chicken, fish, seafood. Last night had homemade chicken salad. Occasional servings of rice or pasta - Snacks: olives, cashews, trail mix - Drinks: water or zero soda  Current physical activity: no current physical activity right now.  Current medication access support: Medicaid insurance   Objective:  BP Readings from Last 3 Encounters:  12/22/23 (!) 155/80  12/21/23 126/71  09/29/23 (!) 158/89    Lab Results  Component Value Date   HGBA1C 7.9 (A) 12/21/2023   HGBA1C 8.4 (A) 09/20/2023   HGBA1C 11.9 (A) 05/23/2023       Latest Ref Rng & Units 12/21/2023   10:54 AM 07/29/2023    3:24 PM 03/29/2023   12:58 PM  BMP  Glucose 70 - 99 mg/dL 859  99  692   BUN 6 - 24 mg/dL 8  15  13    Creatinine 0.76 - 1.27 mg/dL 8.96  8.96  0.86   BUN/Creat Ratio 9 - 20 8  15     Sodium 134 - 144 mmol/L 142  142  138   Potassium 3.5 - 5.2 mmol/L 4.5  4.5  4.0   Chloride 96 - 106 mmol/L 105  101  101   CO2 20 - 29 mmol/L 24  23  28    Calcium  8.7 - 10.2 mg/dL 9.5  89.9  9.3     Lab Results  Component Value Date   CHOL 125 12/21/2023   HDL 54 12/21/2023   LDLCALC 60 12/21/2023   TRIG 45  12/21/2023   CHOLHDL 2.3 12/21/2023    Medications Reviewed Today   Medications were not reviewed in this encounter       Assessment/Plan:    Diabetes: - Currently uncontrolled with most recent A1C of 8.4% above goal <7%, but much improved from 11.9% previously. Medication adherence appears appropriate. Patient is experiencing s/sx of hypoglycemia on high-dose sulfonylurea, glipizide . Would prefer to transition him to GLP-1RA Trulicity  to decrease risk of hypoglycemia and promote long term benefits such as CV risk reduction and kidney protection. BMI is WNL, however patient reports having a inconsistent appetite, and as a less potent GLP-1RA, Trulicity  would be appropriate to initiate.  - Last UACR March 2025 17 mg/g (prev 39 mg/g) - Patient denies personal or family history of multiple endocrine neoplasia type 2, medullary thyroid cancer; personal history of pancreatitis or gallbladder disease. - Reviewed long term cardiovascular and renal outcomes of uncontrolled blood sugar - Reviewed goal A1c, goal fasting, and goal 2 hour post prandial glucose - Reviewed hypoglycemia management plan and the rule of 15 - Reviewed dietary modifications including  utilizing the healthy plate method, limiting portion size of carbohydrate foods, increasing intake of protein and non-starchy vegetables. Counseled patient to stay hydrated with water throughout the day. - Reviewed lifestyle modifications including: aiming for 150 minutes of moderate intensity exercise every week.  - Recommend to START Trulicity  0.75 mg once weekly. Patient was extensively educated on administration, AE, and storage. Will collaborate with patient advocate team to submit PA. - Recommend to DECREASE glipizde to 5 mg once daily with the first meal of the day after starting Trulicity .  - Recommend to continue metformin  IR 1000 mg BID and Jardiance  25 mg daily as prescribed. Can consider transitioning to combination pill to reduce  pill burden at follow-up.   - Recommend to check glucose twice daily: fasting and 2-hr PPG . Counseled patient to bring glucometer or BG log to every appointment. Instructed to contact office if he continues to have s/sx of hypoglycemia with BG < 70 mg/dL.  - Next A1C due Oct 2025    Written patient instructions provided. Patient verbalized understanding of treatment plan.    Follow Up Plan:  Pharmacist telephone *** PCP clinic visit 03/23/24   Lorain Baseman, PharmD [facility] [phone]

## 2024-01-12 ENCOUNTER — Ambulatory Visit
Admission: RE | Admit: 2024-01-12 | Discharge: 2024-01-12 | Disposition: A | Discharge status: Home / Self Care | Source: Ambulatory Visit | Attending: Nurse Practitioner | Admitting: Nurse Practitioner

## 2024-01-12 DIAGNOSIS — M79601 Pain in right arm: Secondary | ICD-10-CM

## 2024-01-13 ENCOUNTER — Ambulatory Visit: Payer: Self-pay | Admitting: Nurse Practitioner

## 2024-01-27 ENCOUNTER — Other Ambulatory Visit: Payer: Self-pay | Admitting: Nurse Practitioner

## 2024-01-27 DIAGNOSIS — E785 Hyperlipidemia, unspecified: Secondary | ICD-10-CM

## 2024-03-23 ENCOUNTER — Encounter: Payer: Self-pay | Admitting: Nurse Practitioner

## 2024-06-27 ENCOUNTER — Encounter: Admitting: Nurse Practitioner
